# Patient Record
Sex: Female | Born: 1990 | Race: White | Hispanic: No | Smoking: Never smoker
Health system: Southern US, Community
[De-identification: ages and names within clinical notes are randomized; demographics above are authoritative.]

## PROBLEM LIST (undated history)

## (undated) DIAGNOSIS — E162 Hypoglycemia, unspecified: Secondary | ICD-10-CM

## (undated) DIAGNOSIS — J45909 Unspecified asthma, uncomplicated: Secondary | ICD-10-CM

## (undated) HISTORY — PX: CYST EXCISION: SHX5701

## (undated) HISTORY — DX: Unspecified asthma, uncomplicated: J45.909

## (undated) HISTORY — DX: Hypoglycemia, unspecified: E16.2

## (undated) HISTORY — PX: APPENDECTOMY: SHX54

## (undated) HISTORY — PX: OTHER SURGICAL HISTORY: SHX169

---
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
## 2010-03-24 ENCOUNTER — Emergency Department (HOSPITAL_COMMUNITY): Admission: EM | Admit: 2010-03-24 | Discharge: 2010-03-24 | Payer: Self-pay | Admitting: Emergency Medicine

## 2010-03-24 IMAGING — CT CT ABD-PELV W/O CM
3 of 4 series · 10 of 46 positions shown, 15 images · non-contrast
Comparison: None.

CLINICAL DATA: Low back pain.  Right side flank pain.

CT ABDOMEN AND PELVIS WITHOUT CONTRAST
TECHNIQUE: Multidetector CT imaging of the abdomen and pelvis was
performed following the standard protocol without intravenous
contrast.

[Series 3: lung 5.0 b60f · axial · 0.58mm/px · z∈[+583,+683]mm · 6 of 30 slices shown, 11 images]
[im 5/30  soft-tissue]
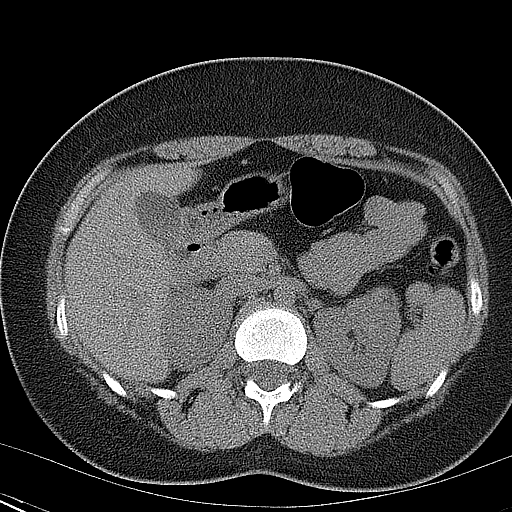
[im 5/30  bone]
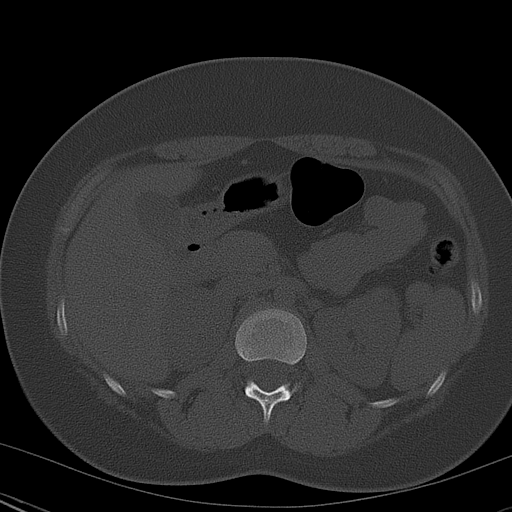
[im 9/30  soft-tissue]
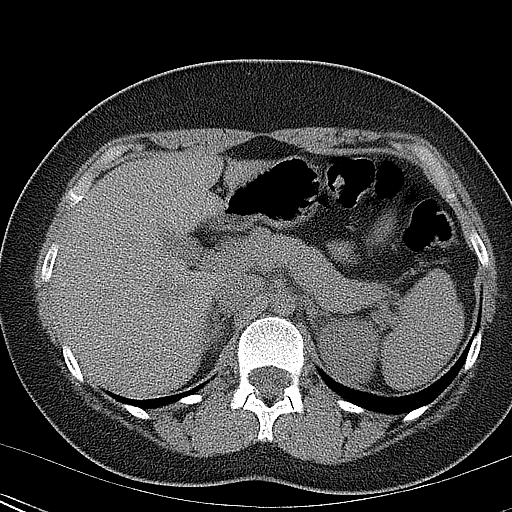
[im 13/30  soft-tissue]
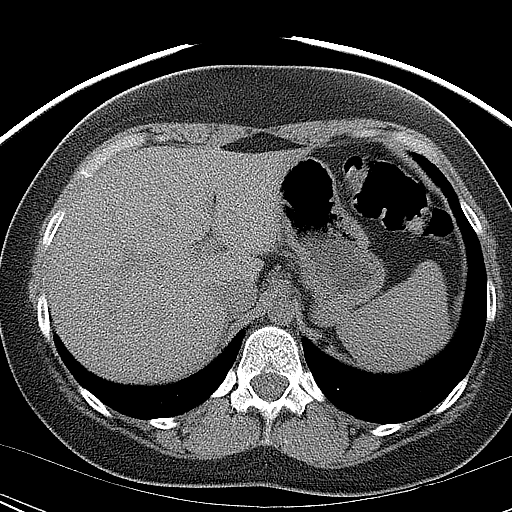
[im 13/30  lung]
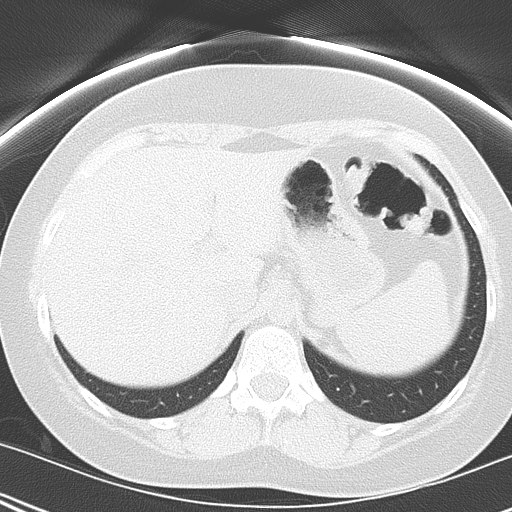
[im 17/30  soft-tissue]
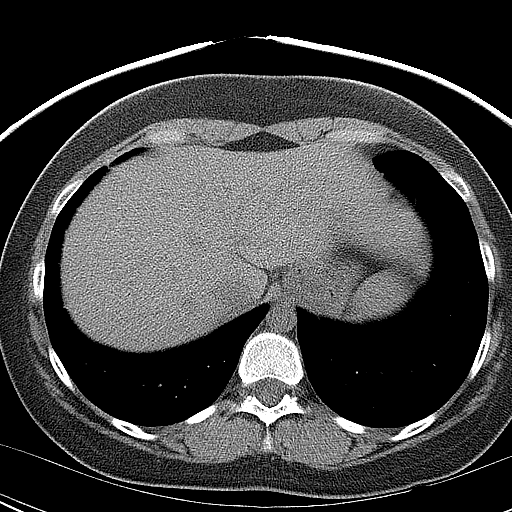
[im 17/30  lung]
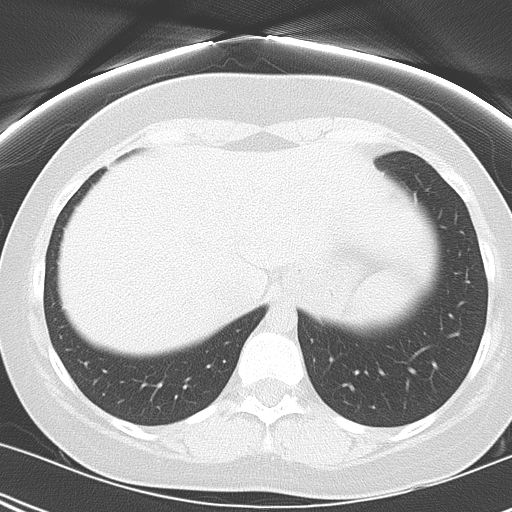
[im 21/30  soft-tissue]
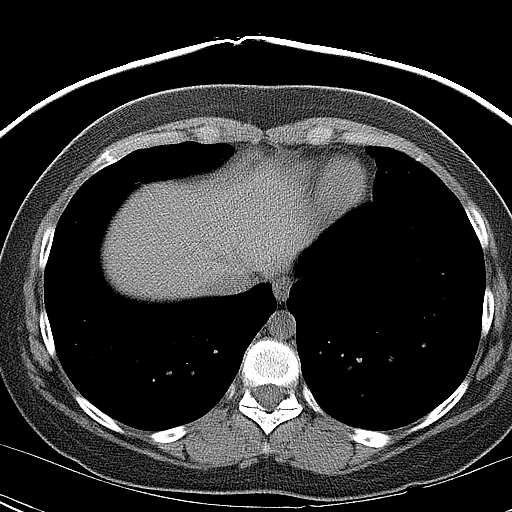
[im 21/30  lung]
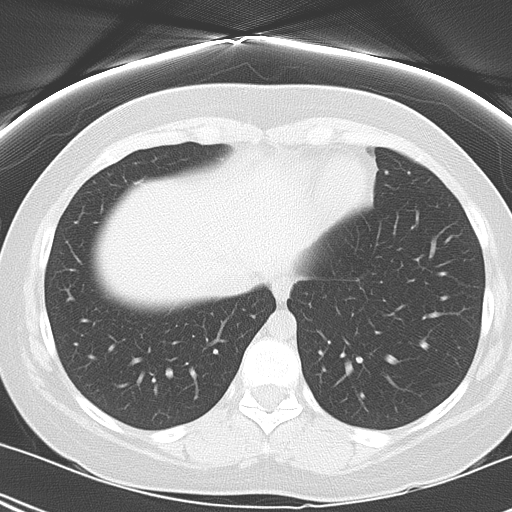
[im 25/30  soft-tissue]
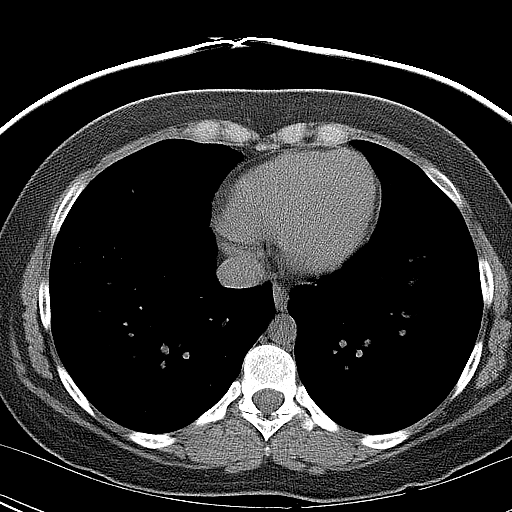
[im 25/30  lung]
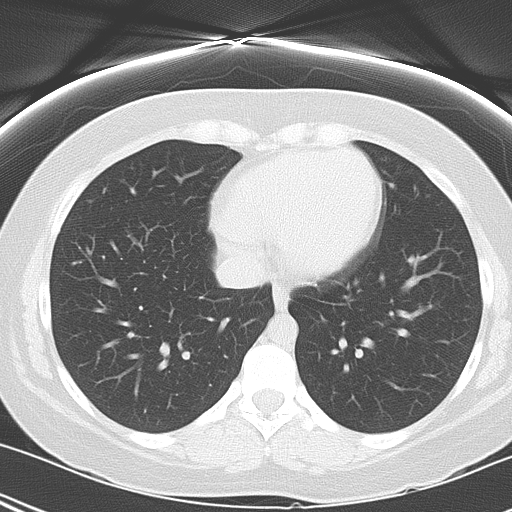

[Series 4: mpr coronal (id) · coronal · 0.57mm/px · 3 of 91 slices shown]
[im 31/91  soft-tissue]
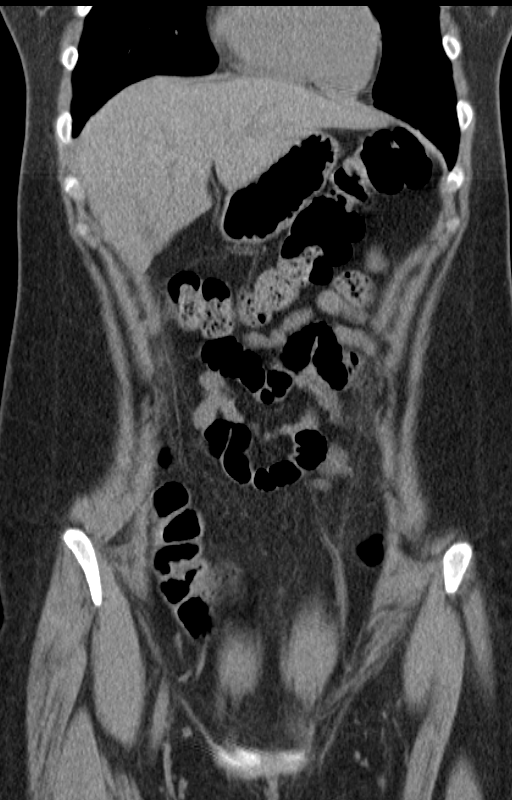
[im 41/91  soft-tissue]
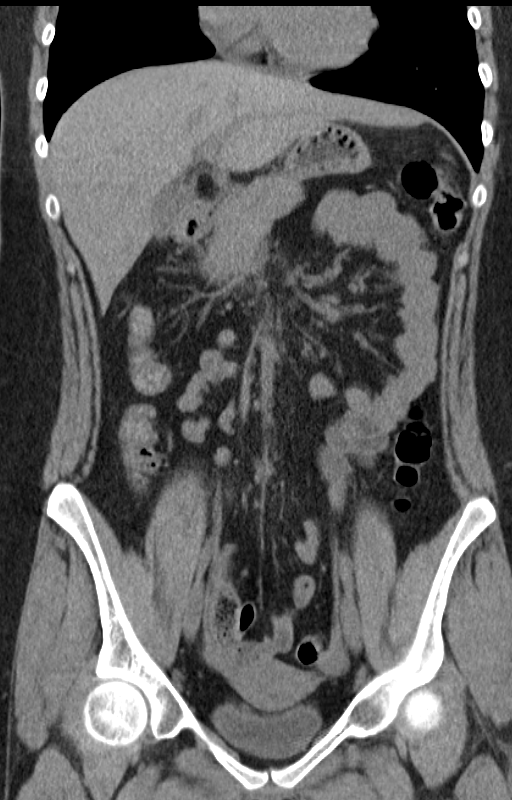
[im 51/91  soft-tissue]
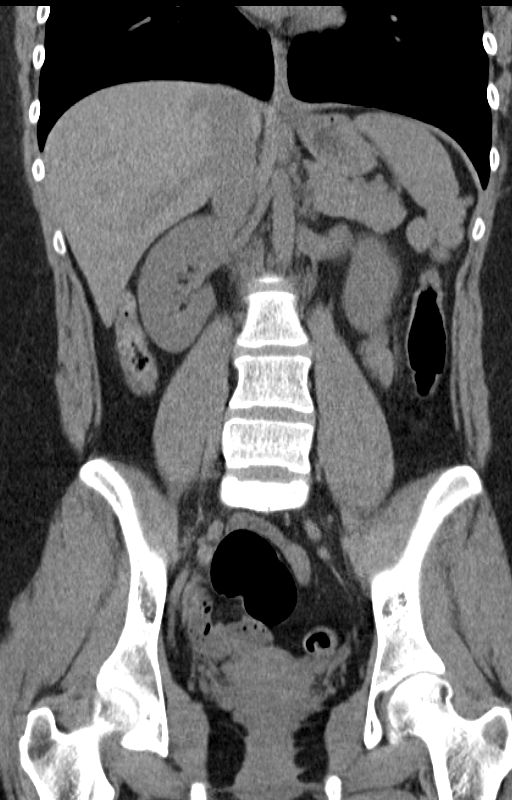

[Series 5: mpr sagittal (id) · sagittal · 0.51mm/px · 1 of 98 slices shown]
[im 33/98  soft-tissue]
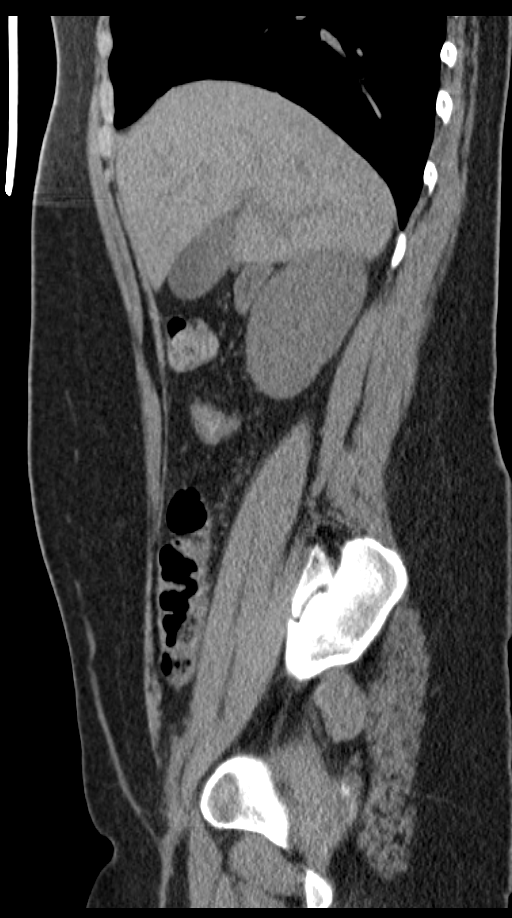

[10 of 46 positions shown; findings below may reference images not displayed]

FINDINGS: The liver and spleen have normal uninfused imaging
features.  The stomach, duodenum, pancreas, gallbladder, adrenal
glands, and kidneys are unremarkable.  Specifically, there is no
evidence for renal calculi or secondary changes in either kidney.

No abdominal aortic aneurysm.  No abdominal lymphadenopathy.  No
abdominal free fluid.  The abdominal bowel loops are normal.

Imaging through the pelvis shows no stones in the bladder.  The no
evidence for ureteral stone.  Uterus is unremarkable.  No adnexal
mass.  The terminal ileum is normal.  The appendix is normal.
Borderline lymphadenopathy is seen in the ileocolic mesentery.

Bone windows reveal no worrisome lytic or sclerotic osseous
lesions.
IMPRESSION: Upper normal lymph nodes in the ileocolic mesentery.  Otherwise
normal exam.

## 2010-04-06 ENCOUNTER — Ambulatory Visit: Payer: Self-pay | Admitting: Otolaryngology

## 2010-04-11 ENCOUNTER — Ambulatory Visit (HOSPITAL_COMMUNITY): Admission: RE | Admit: 2010-04-11 | Discharge: 2010-04-11 | Payer: Self-pay | Admitting: Otolaryngology

## 2010-04-11 IMAGING — CT CT NECK W/ CM
3 of 4 series · 12 of 33 positions shown, 14 images · IV contrast (Omnipaque 300)
Comparison: None.

CLINICAL DATA: 18-year-old female with left neck mass, reportedly
times 3 years.

CT NECK WITH CONTRAST
TECHNIQUE: Multidetector CT imaging of the neck was performed with
intravenous contrast.
Contrast: 100 ml [JB].

[Series 2: soft tissue neck 2.0 b31s · axial · 0.35mm/px · z∈[+50,+198]mm · 4 of 112 slices shown, 5 images]
[im 19/112  soft-tissue]
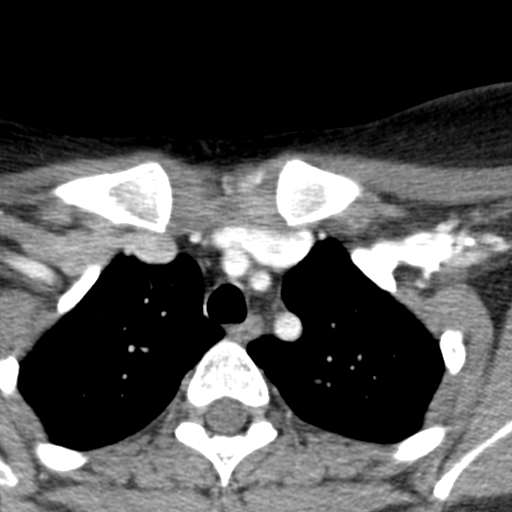
[im 19/112  bone]
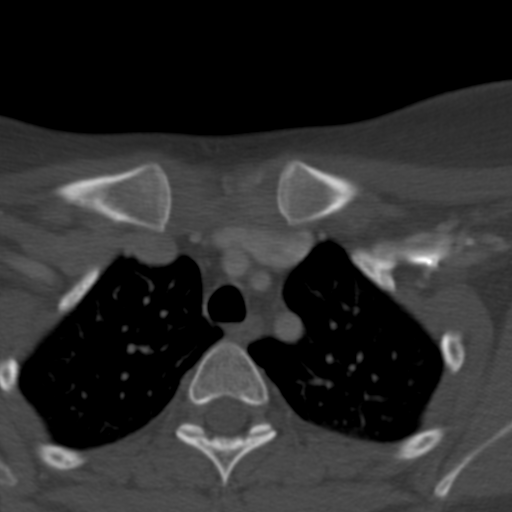
[im 38/112  bone]
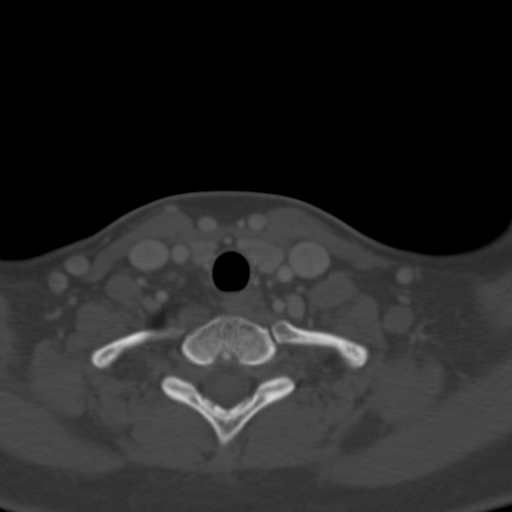
[im 75/112  bone]
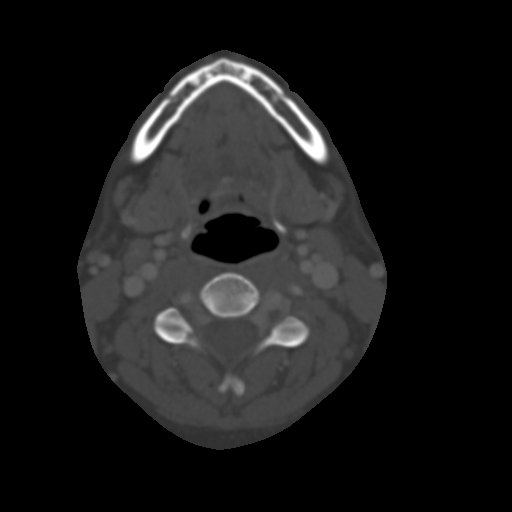
[im 93/112  bone]
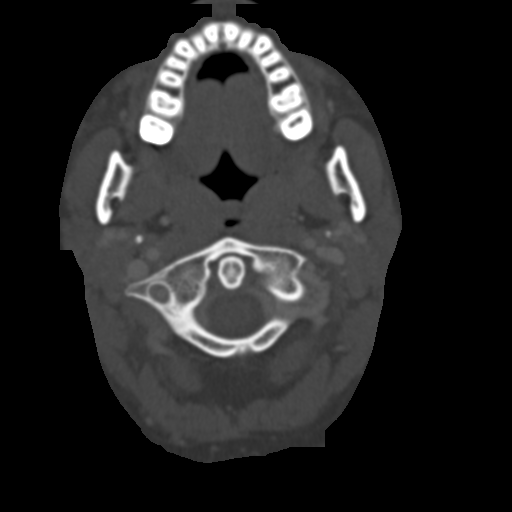

[Series 4: neck 2.0 soft tissue coronal · coronal · 0.28mm/px · 3 of 76 slices shown]
[im 16/76  bone]
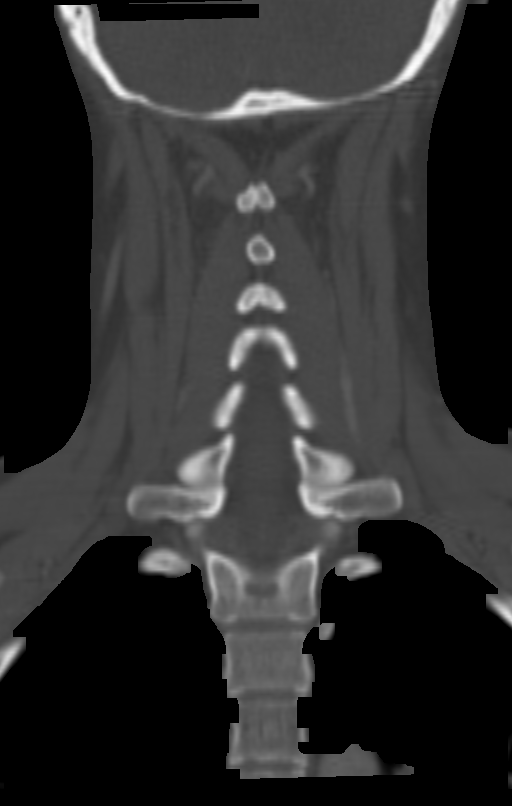
[im 31/76  bone]
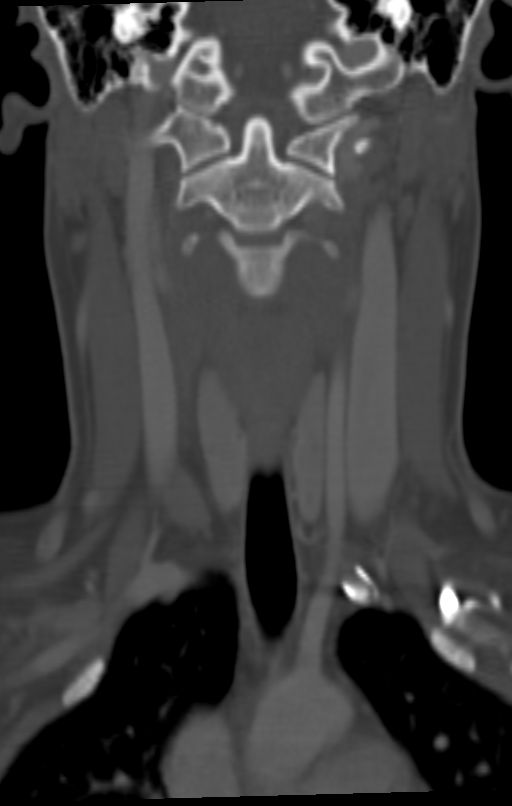
[im 46/76  bone]
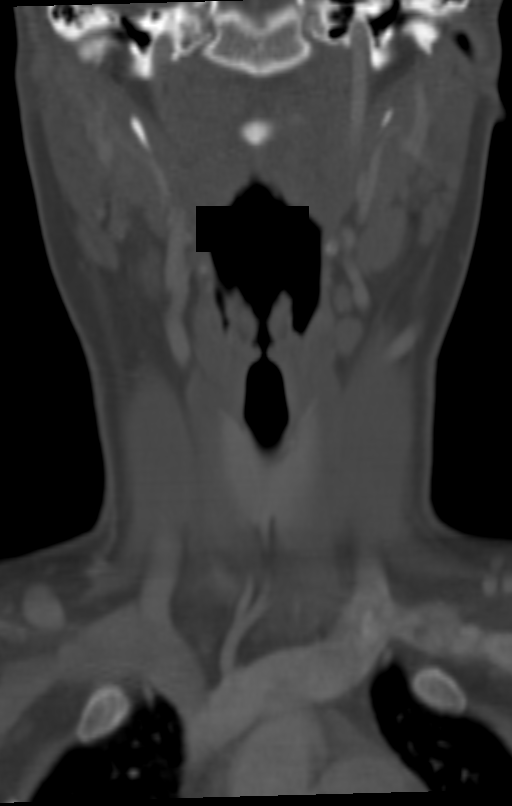

[Series 5: neck 2.0 soft tissue sag · sagittal · 0.31mm/px · 5 of 71 slices shown, 6 images]
[im 24/71  bone]
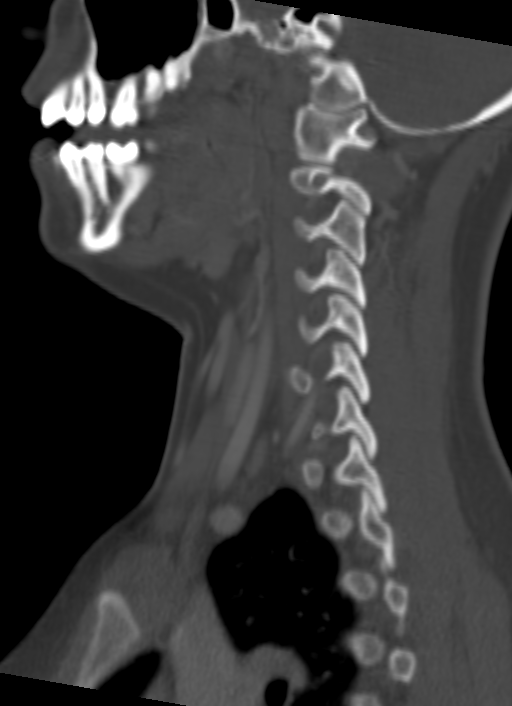
[im 30/71  bone]
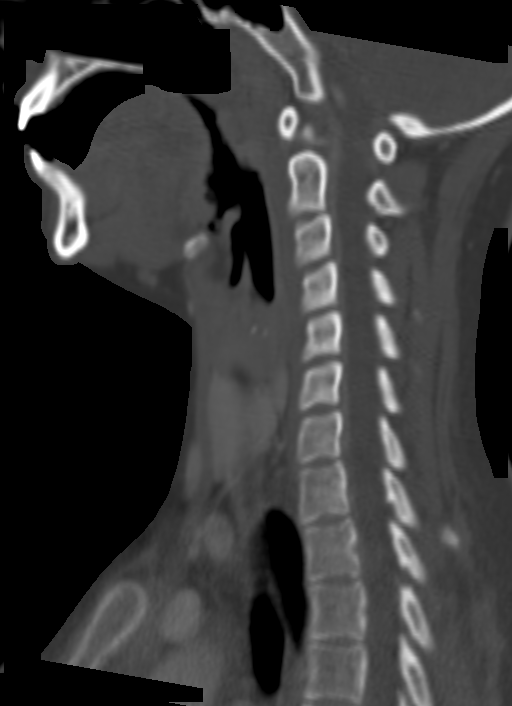
[im 36/71  soft-tissue]
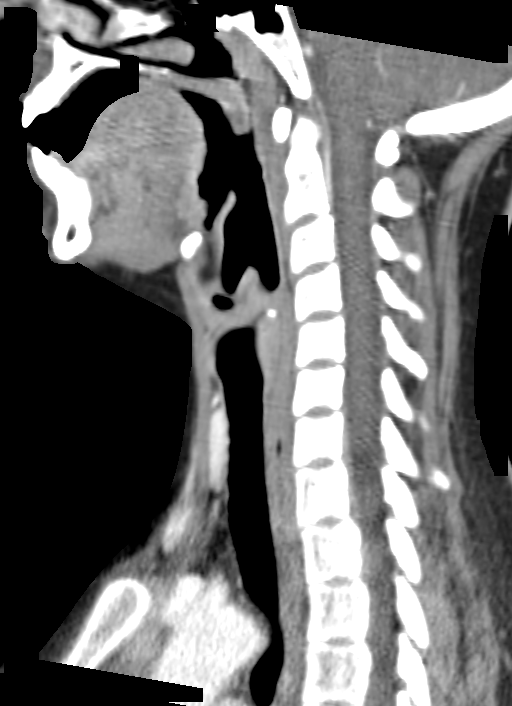
[im 36/71  bone]
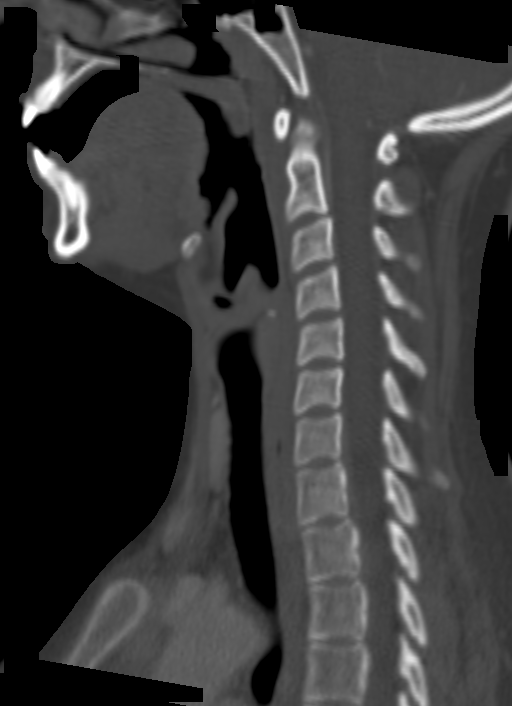
[im 41/71  bone]
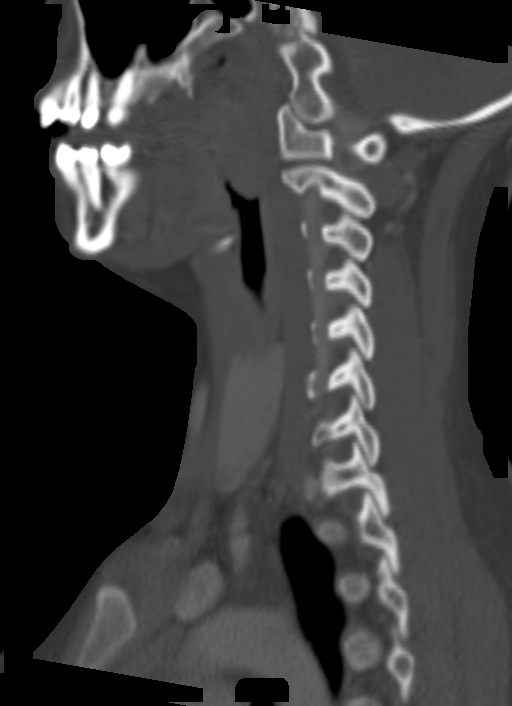
[im 47/71  bone]
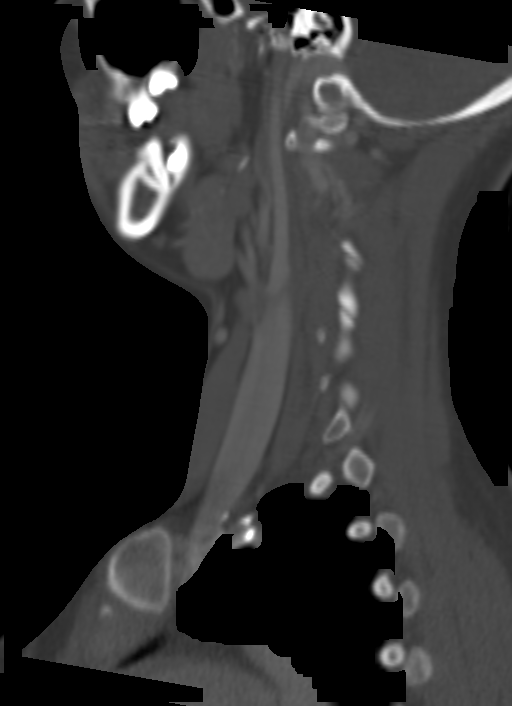

[12 of 33 positions shown; findings below may reference images not displayed]

FINDINGS: Cervical lymph nodes are fairly numerous bilaterally.
Left side level IIA nodes are slightly larger than the right (9 mm
short axis versus 7-8 mm short axis).  There is a 7 mm asymmetric
left level IIIA node on series 2 image 48.  Other nodes are very
small.  No discrete neck mass or other asymmetry of the neck soft
tissues is identified.

Major vascular structures are patent.  There is a prominent venous
confluence of the left external jugular vein and midline/thyroidal
vein near the left clavicle (series 4 image 32). No acute osseous
abnormality identified.  Visualized lungs are clear aside from mild
atelectasis.  Symmetric appearance of tonsillar pillars and mild
adenoid hypertrophy. Otherwise the pharyngeal mucosal spaces are
within normal limits.  Thyroid is unremarkable.  Parapharyngeal,
retropharyngeal and sublingual spaces are within normal limits.
Submandibular and parotid glands are within normal limits.
Visualized paranasal sinuses and mastoids are clear.  Visualized
brain parenchyma is within normal limits.
IMPRESSION: Mild asymmetry of sub centimeter left level IIA and IIIA lymph
nodes.  Otherwise, no left neck asymmetry or abnormality.
The area of clinical concern could not be palpated/marked at the
time of the exam.

## 2010-08-23 ENCOUNTER — Emergency Department (HOSPITAL_COMMUNITY)
Admission: EM | Admit: 2010-08-23 | Discharge: 2010-08-23 | Payer: Self-pay | Source: Home / Self Care | Admitting: Emergency Medicine
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
## 2010-11-18 LAB — URINALYSIS, ROUTINE W REFLEX MICROSCOPIC
Bilirubin Urine: NEGATIVE
Glucose, UA: NEGATIVE mg/dL
Hgb urine dipstick: NEGATIVE
Ketones, ur: NEGATIVE mg/dL
Nitrite: NEGATIVE
Protein, ur: NEGATIVE mg/dL
Specific Gravity, Urine: >1.03 — ABNORMAL HIGH (ref 1.005–1.030)
Urobilinogen, UA: 0.2 mg/dL (ref 0.0–1.0)
pH: 5.5 (ref 5.0–8.0)

## 2010-11-18 LAB — POCT PREGNANCY, URINE: Preg Test, Ur: NEGATIVE

## 2010-12-11 LAB — URINE MICROSCOPIC-ADD ON

## 2010-12-11 LAB — URINE CULTURE: Colony Count: 30000

## 2010-12-11 LAB — URINALYSIS, ROUTINE W REFLEX MICROSCOPIC
Bilirubin Urine: NEGATIVE
Glucose, UA: NEGATIVE mg/dL
Ketones, ur: NEGATIVE mg/dL
Nitrite: NEGATIVE
Protein, ur: NEGATIVE mg/dL
Specific Gravity, Urine: 1.01 (ref 1.005–1.030)
Urobilinogen, UA: 0.2 mg/dL (ref 0.0–1.0)
pH: 7 (ref 5.0–8.0)

## 2010-12-11 LAB — PREGNANCY, URINE: Preg Test, Ur: NEGATIVE

## 2011-01-19 NOTE — Op Note (Signed)
NAMEROSELENE, GRAY                ACCOUNT NO.:  [id number]   MEDICAL RECORD NO.:  [id number]          PATIENT TYPE:  AMB   LOCATION:  [facility]                          FACILITY:  [facility]   PHYSICIAN:  Vikki Ports, MDDATE OF BIRTH:  12/30/1990   DATE OF PROCEDURE:  03/07/2005  DATE OF DISCHARGE:                                 OPERATIVE REPORT   PREOPERATIVE DIAGNOSIS:  Pilonidal cyst and sinus.   POSTOPERATIVE DIAGNOSIS:  Pilonidal cyst and sinus.   PROCEDURE:  Pilonidal cystectomy.   SURGEON:  Vikki Ports, M.D.   ANESTHESIA:  General.   DESCRIPTION OF PROCEDURE:  The patient was taken to the operating room and  placed in a supine position.  After adequate general anesthesia was induced  using endotracheal, the patient was placed in the prone position.  The  superior gluteal cleft was prepped and draped in a normal sterile fashion.  The buttocks were taped apart.  An elliptical incision was made around the  obvious sinus openings removing all tissue en bloc down to the coccygeal  fascia.  There was no further inflammation.  No other evidence of abscess  was noted.  The wound was then closed with interrupted 2-0 Vicryl sutures in  multiple layers and the skin was closed with interrupted 2-0 nylon vertical  mattress sutures and staples.  A sterile dressing was applied.  The patient  tolerated the procedure well and went to the PACU in good condition.       KRH/MEDQ  D:  03/07/2005  T:  03/07/2005  Job:  [id number]

## 2011-01-19 NOTE — Op Note (Signed)
[name], Nancy Novak                ACCOUNT NO.:  [id number]   MEDICAL RECORD NO.:  [id number]          PATIENT TYPE:  OUT   LOCATION:  [facility]                         FACILITY:  [facility]   PHYSICIAN:  Vikki Ports, MDDATE OF BIRTH:  03-02-1991   DATE OF PROCEDURE:  03/21/2005  DATE OF DISCHARGE:  03/07/2005                                 OPERATIVE REPORT   PREOPERATIVE DIAGNOSIS:  Pilonidal cyst and sinus.   POSTOPERATIVE DIAGNOSIS:  Pilonidal cyst and sinus.   PROCEDURE:  Pilonidal cystectomy.   SURGEON:  Vikki Ports, M.D.   ANESTHESIA:  General.   DESCRIPTION OF PROCEDURE:  The patient was taken to the operating room and  placed in the supine position after adequate general anesthesia was induced.  The patient was placed in the prone position.  The superior gluteal cleft  was prepped and draped in the normal sterile fashion.  An elliptical  incision was made around the sinus openings and dissected down onto the  coccyx, excising all tissue en bloc.  This was sent for pathologic  evaluation.  Adequate hemostasis was ensured and it was closed in layers  using deep #2-0 Vicryl sutures, and the skin was closed with interrupted #3-  0 nylon vertical mattress sutures, alternating with staples.  A sterile  dressing was applied.   The patient tolerated the procedure well and went to PACU in good condition.       KRH/MEDQ  D:  03/21/2005  T:  03/21/2005  Job:  [id number]

## 2012-03-03 ENCOUNTER — Ambulatory Visit (HOSPITAL_COMMUNITY)
Admission: RE | Admit: 2012-03-03 | Discharge: 2012-03-03 | Disposition: A | Discharge status: Home / Self Care | Payer: BC Managed Care – PPO | Source: Ambulatory Visit | Attending: Pediatrics | Admitting: Pediatrics

## 2012-03-03 ENCOUNTER — Other Ambulatory Visit (HOSPITAL_COMMUNITY): Payer: Self-pay | Admitting: Pediatrics

## 2012-03-03 DIAGNOSIS — R229 Localized swelling, mass and lump, unspecified: Secondary | ICD-10-CM | POA: Insufficient documentation

## 2012-03-03 IMAGING — CR DG TOE 3RD 2+V*R*
3 series · 3 of 3 positions shown · non-contrast
Comparison: None.

CLINICAL DATA: Mass on the right third toe.

RIGHT THIRD TOE

[view not recorded (1 of 3)]
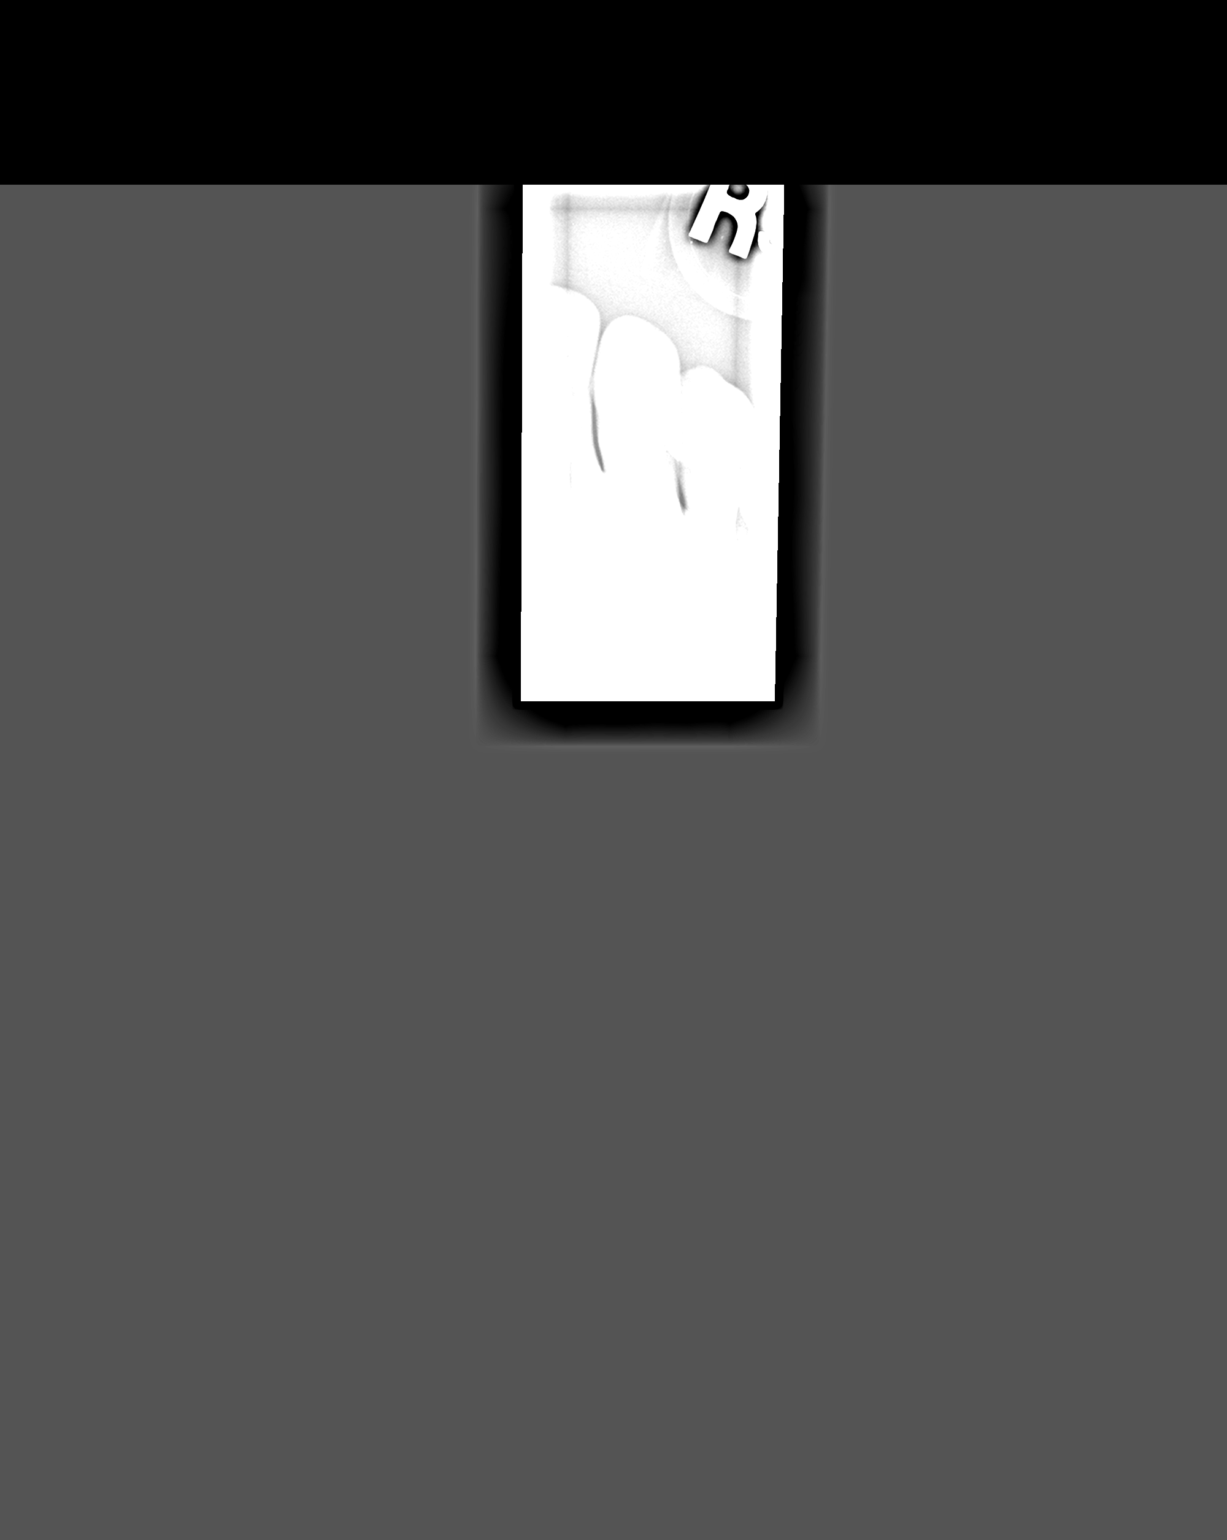

[view not recorded (2 of 3)]
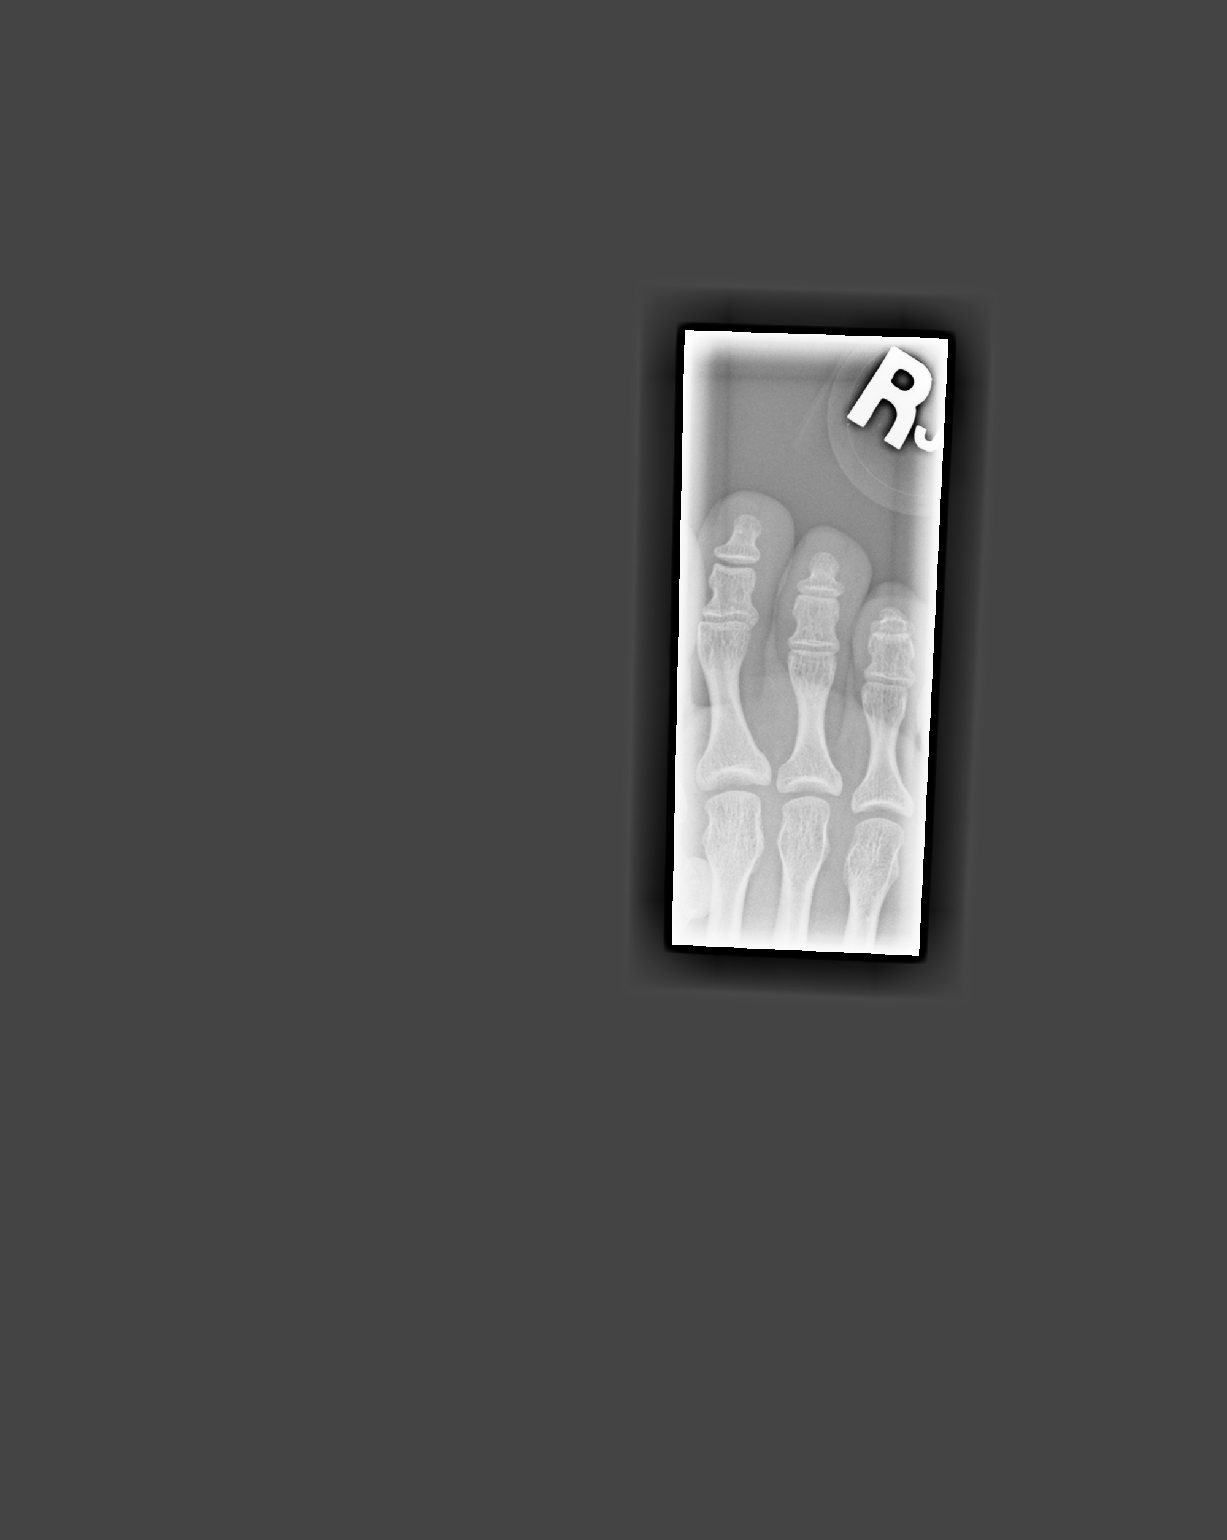

[view not recorded (3 of 3)]
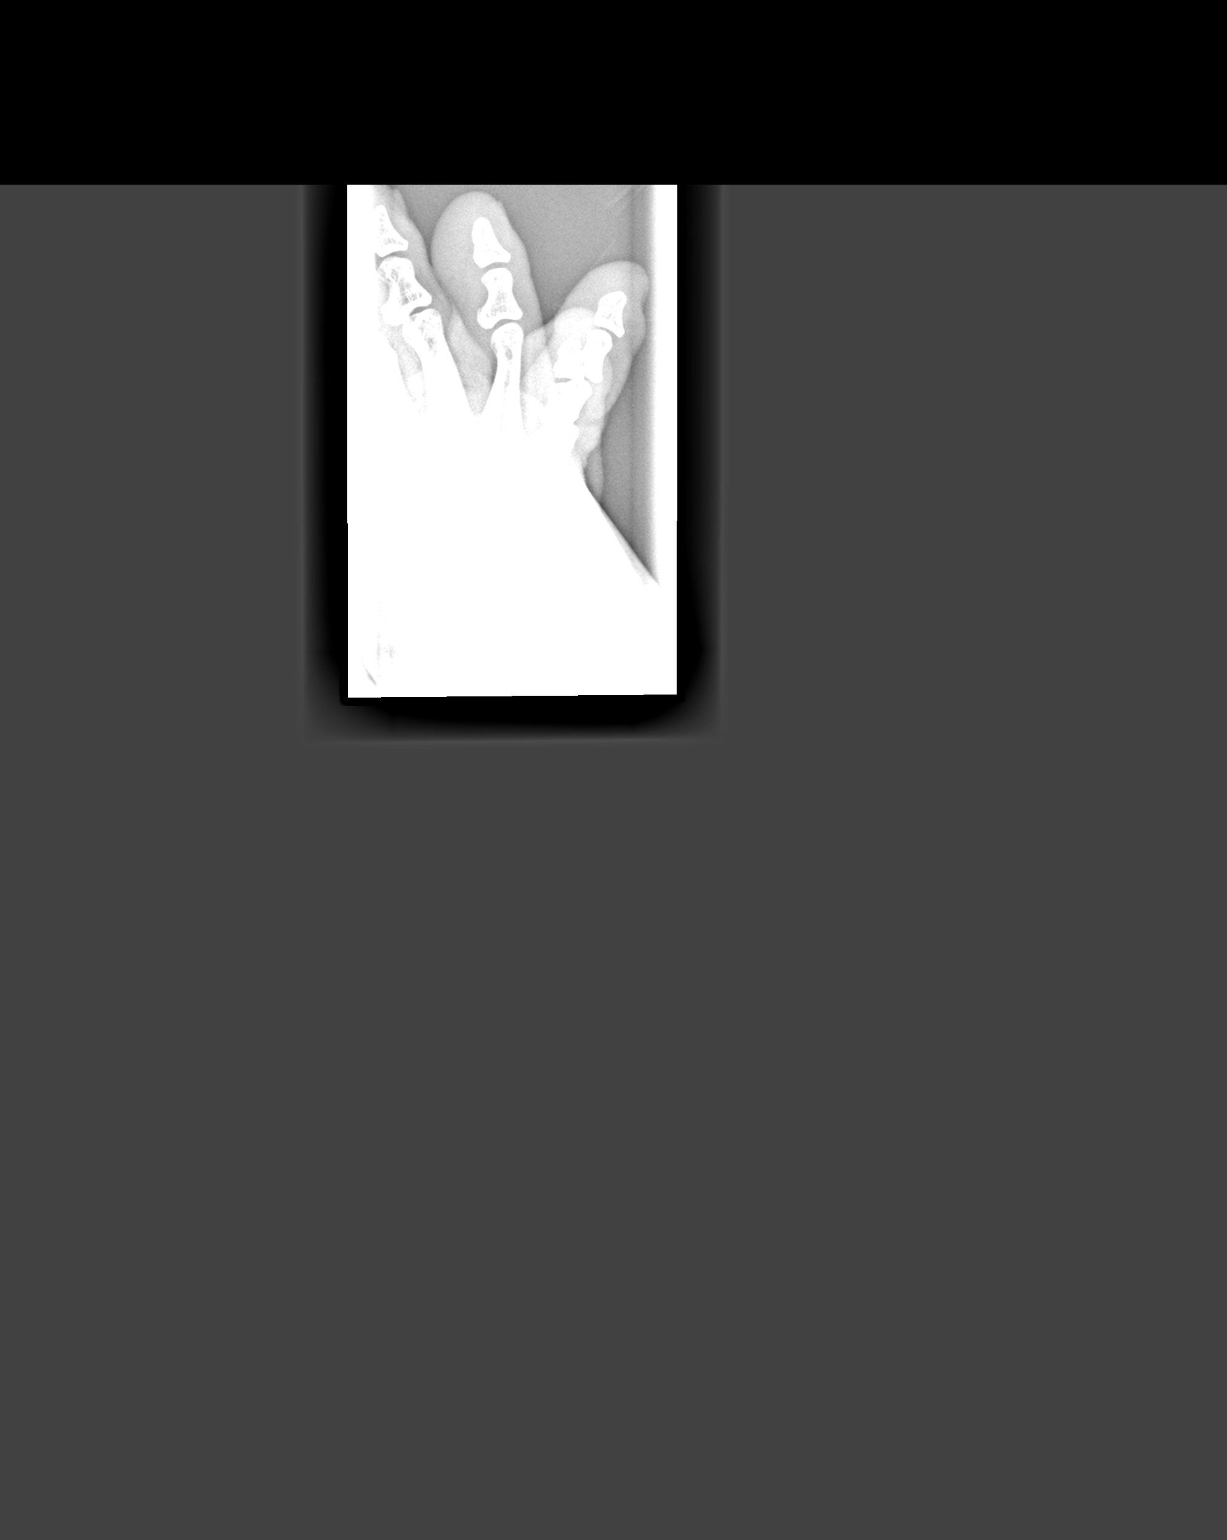

[3 of 3 positions shown; findings below may reference images not displayed]

FINDINGS: There are no osseous abnormalities of the third toe.  No
erosions or joint space narrowing.
IMPRESSION: Normal exam.  No visible mass.

## 2012-03-31 ENCOUNTER — Telehealth (HOSPITAL_COMMUNITY): Payer: Self-pay | Admitting: Dietician

## 2012-03-31 NOTE — Telephone Encounter (Signed)
Received referral via fax from Dr. Khalifa for dx: obesity.  

## 2012-04-04 NOTE — Telephone Encounter (Signed)
Sent letter to pt home via [employer] Mail in attempt to contact pt to schedule appointment.  

## 2012-04-11 NOTE — Telephone Encounter (Signed)
Sent letter to pt home via [employer] Mail in attempt to contact pt to schedule appointment.  

## 2012-04-18 NOTE — Telephone Encounter (Signed)
Pt has not responded to attempts to contact to schedule appointment. Referral filed.  

## 2012-12-03 ENCOUNTER — Other Ambulatory Visit: Payer: Self-pay | Admitting: Obstetrics & Gynecology

## 2013-03-16 ENCOUNTER — Ambulatory Visit: Payer: BC Managed Care – PPO | Attending: Sports Medicine | Admitting: Physical Therapy

## 2013-03-16 DIAGNOSIS — M25669 Stiffness of unspecified knee, not elsewhere classified: Secondary | ICD-10-CM | POA: Insufficient documentation

## 2013-03-16 DIAGNOSIS — R269 Unspecified abnormalities of gait and mobility: Secondary | ICD-10-CM | POA: Insufficient documentation

## 2013-03-16 DIAGNOSIS — R5381 Other malaise: Secondary | ICD-10-CM | POA: Insufficient documentation

## 2013-03-16 DIAGNOSIS — IMO0001 Reserved for inherently not codable concepts without codable children: Secondary | ICD-10-CM | POA: Insufficient documentation

## 2013-03-16 DIAGNOSIS — M25569 Pain in unspecified knee: Secondary | ICD-10-CM | POA: Insufficient documentation

## 2013-03-18 ENCOUNTER — Ambulatory Visit: Payer: BC Managed Care – PPO | Admitting: Physical Therapy

## 2013-03-24 ENCOUNTER — Ambulatory Visit: Payer: BC Managed Care – PPO | Admitting: Physical Therapy

## 2013-03-25 ENCOUNTER — Other Ambulatory Visit: Payer: Self-pay | Admitting: Obstetrics & Gynecology

## 2013-03-26 ENCOUNTER — Ambulatory Visit: Payer: BC Managed Care – PPO | Admitting: Physical Therapy

## 2013-04-02 ENCOUNTER — Ambulatory Visit: Payer: BC Managed Care – PPO | Admitting: Physical Therapy

## 2013-04-07 ENCOUNTER — Ambulatory Visit: Payer: BC Managed Care – PPO | Attending: Sports Medicine | Admitting: Physical Therapy

## 2013-04-07 DIAGNOSIS — R269 Unspecified abnormalities of gait and mobility: Secondary | ICD-10-CM | POA: Insufficient documentation

## 2013-04-07 DIAGNOSIS — IMO0001 Reserved for inherently not codable concepts without codable children: Secondary | ICD-10-CM | POA: Insufficient documentation

## 2013-04-07 DIAGNOSIS — M25569 Pain in unspecified knee: Secondary | ICD-10-CM | POA: Insufficient documentation

## 2013-04-07 DIAGNOSIS — M25669 Stiffness of unspecified knee, not elsewhere classified: Secondary | ICD-10-CM | POA: Insufficient documentation

## 2013-04-07 DIAGNOSIS — R5381 Other malaise: Secondary | ICD-10-CM | POA: Insufficient documentation

## 2013-04-09 ENCOUNTER — Ambulatory Visit: Payer: BC Managed Care – PPO | Admitting: Physical Therapy

## 2013-04-14 ENCOUNTER — Ambulatory Visit: Payer: BC Managed Care – PPO

## 2013-04-16 ENCOUNTER — Encounter: Payer: BC Managed Care – PPO | Admitting: Physical Therapy

## 2013-06-13 ENCOUNTER — Other Ambulatory Visit: Payer: Self-pay | Admitting: Obstetrics & Gynecology

## 2013-10-05 ENCOUNTER — Other Ambulatory Visit: Payer: Self-pay | Admitting: *Deleted

## 2013-10-05 MED ORDER — NORGESTIMATE-ETH ESTRADIOL 0.25-35 MG-MCG PO TABS: 1.0000 | ORAL_TABLET | Freq: Every day | ORAL | Status: DC

## 2013-10-07 ENCOUNTER — Other Ambulatory Visit: Payer: Self-pay | Admitting: *Deleted

## 2013-10-07 MED ORDER — NORGESTIM-ETH ESTRAD TRIPHASIC 0.18/0.215/0.25 MG-35 MCG PO TABS: 1.0000 | ORAL_TABLET | Freq: Every day | ORAL | Status: DC

## 2013-10-08 ENCOUNTER — Telehealth: Payer: Self-pay | Admitting: *Deleted

## 2013-10-08 NOTE — Telephone Encounter (Signed)
Pt informed refill for OCP sent to [facility], [place]. Pt requesting pharmacy to be changed to [facility], [place].

## 2013-10-21 ENCOUNTER — Other Ambulatory Visit: Payer: Self-pay | Admitting: Women's Health

## 2013-11-06 ENCOUNTER — Telehealth: Payer: Self-pay | Admitting: *Deleted

## 2013-11-06 NOTE — Telephone Encounter (Signed)
Pt wants pharmacy changed to [facility]. Pharmacy changed in [place].

## 2014-10-19 ENCOUNTER — Telehealth: Payer: Self-pay | Admitting: *Deleted

## 2014-10-19 NOTE — Telephone Encounter (Signed)
Pt states she does not want to get a refill on her birth control pills due to desiring pregnancy. Pt has had a HX of miscarriages and would like to discuss with a provider her concerns. Call transferred to front staff for an appt to be scheduled.

## 2014-10-26 ENCOUNTER — Encounter: Payer: Self-pay | Admitting: Obstetrics & Gynecology

## 2014-10-26 ENCOUNTER — Ambulatory Visit (INDEPENDENT_AMBULATORY_CARE_PROVIDER_SITE_OTHER): Payer: Managed Care, Other (non HMO) | Admitting: Obstetrics & Gynecology

## 2014-10-26 VITALS — BP 120/80 | Ht 64.0 in | Wt 197.0 lb

## 2014-10-26 DIAGNOSIS — Z3009 Encounter for other general counseling and advice on contraception: Secondary | ICD-10-CM

## 2014-10-26 DIAGNOSIS — Z3202 Encounter for pregnancy test, result negative: Secondary | ICD-10-CM

## 2014-10-26 DIAGNOSIS — Z3169 Encounter for other general counseling and advice on procreation: Secondary | ICD-10-CM

## 2014-10-26 DIAGNOSIS — N926 Irregular menstruation, unspecified: Secondary | ICD-10-CM

## 2014-10-26 LAB — POCT URINE PREGNANCY: Preg Test, Ur: NEGATIVE

## 2014-10-27 ENCOUNTER — Encounter: Payer: Self-pay | Admitting: Obstetrics & Gynecology

## 2014-10-27 MED ORDER — PRENATAL VITAMINS PLUS 27-1 MG PO TABS: 1.0000 | ORAL_TABLET | Freq: Every day | ORAL | Status: DC

## 2014-10-27 NOTE — Progress Notes (Signed)
Patient ID: Nancy Novak, female   DOB: [birth year], 24 y.o.   MRN: [id number] Chief Complaint  Patient presents with  . Infertility    Discuss infertility   Blood pressure 120/80, height 5\' 4"  (1.626 m), weight 197 lb (89.359 kg), last menstrual period 10/22/2014.   Nancy Novak [id number] Patient's last menstrual period was 10/22/2014.  Patient is here for preconceptual counseling, not infertility as noted above She experienced an early pregnancy loss last summer Has been on OCP since that time and done well on those She stopped her OCPs in January and just recently had her first post-contraceptive menses which was without difficulty We talked in great detail about the etiology of early pregnancy loss, statistical probability of it happening again both in her case and in general as well as the multitude of causes the vast majority of which are simply uncontrollable notable inadequate cares made at the time of ovulation and conception Patient and her husband both present of course a conversation when many different directions with those questions all were answered The patient was certainly visibly upset during part of it but was reassured at the end of our visit with the information and data given She is encouraged to begin prenatal vitamins right away in anticipation of getting pregnant sometime may or after I also recommended that as soon as she finds out she is pregnant she come in for pregnancy status evaluation clue doing a serum progesterone She agrees with that management plan  Face-to-face time was 20 minutes  Greater than 50% of the time during this visit was spent in answering questions and  in general counseling

## 2016-04-09 DIAGNOSIS — M461 Sacroiliitis, not elsewhere classified: Secondary | ICD-10-CM | POA: Insufficient documentation

## 2016-04-09 DIAGNOSIS — M5432 Sciatica, left side: Secondary | ICD-10-CM | POA: Insufficient documentation

## 2016-04-09 DIAGNOSIS — M5431 Sciatica, right side: Secondary | ICD-10-CM | POA: Insufficient documentation

## 2016-06-07 ENCOUNTER — Ambulatory Visit (INDEPENDENT_AMBULATORY_CARE_PROVIDER_SITE_OTHER): Payer: PRIVATE HEALTH INSURANCE | Admitting: Physician Assistant

## 2016-06-07 VITALS — BP 110/76 | HR 89 | Temp 98.2°F | Resp 16 | Ht 64.0 in | Wt 214.0 lb

## 2016-06-07 DIAGNOSIS — W57XXXA Bitten or stung by nonvenomous insect and other nonvenomous arthropods, initial encounter: Secondary | ICD-10-CM | POA: Diagnosis not present

## 2016-06-07 DIAGNOSIS — R21 Rash and other nonspecific skin eruption: Secondary | ICD-10-CM

## 2016-06-07 MED ORDER — CETIRIZINE HCL 10 MG PO TABS
10.0000 mg | ORAL_TABLET | Freq: Once | ORAL | Status: AC
  Administered 2016-06-07: 10 mg via ORAL

## 2016-06-07 MED ORDER — IBUPROFEN 600 MG PO TABS: 600.0000 mg | ORAL_TABLET | Freq: Three times a day (TID) | ORAL | 0 refills | Status: DC | PRN

## 2016-06-07 MED ORDER — CEPHALEXIN 500 MG PO CAPS: 500.0000 mg | ORAL_CAPSULE | Freq: Three times a day (TID) | ORAL | 0 refills | Status: DC

## 2016-06-07 MED ORDER — CETIRIZINE HCL 10 MG PO TABS: 10.0000 mg | ORAL_TABLET | Freq: Every day | ORAL | 0 refills | Status: DC

## 2016-06-07 NOTE — Progress Notes (Addendum)
06/07/2016 10:00 AM   DOB: 07-Feb-1991 / MRN: [id number]  SUBJECTIVE:  Nancy Novak is a 25 y.o. female presenting for a spider bite on her right hand which occurred at 6 AM before going into work. Currently, she has a numb/itching and "tingly" sensation in her hand. She saw the spider right after the incident and states that it had a brown color.  Patient has taken benadryl (which decreased her itching) and tylenol.  She denies fever/chills or nausea. She is actively taking birth control.  She is a new patient to the practice.   Patient is up to date with her tetanus shot (3 years ago).   She is allergic to bee venom; other; and codeine.   She  has a past medical history of Asthma.    She  reports that she has quit smoking. She has never used smokeless tobacco. She reports that she does not drink alcohol or use drugs. She  reports that she currently engages in sexual activity. She reports using the following method of birth control/protection: None. The patient  has a past surgical history that includes Cyst excision.  Her family history includes Cancer in her paternal grandfather; Hypertension in her mother and paternal grandmother.  Review of Systems  Constitutional: Negative for chills and fever.  Eyes: Negative for pain, discharge and redness.  Gastrointestinal: Negative for nausea and vomiting.  Skin: Positive for itching.    The problem list and medications were reviewed and updated by myself where necessary and exist elsewhere in the encounter.   OBJECTIVE:  BP 110/76 (BP Location: Right Arm, Patient Position: Sitting, Cuff Size: Normal)   Pulse 89   Temp 98.2 F (36.8 C) (Oral)   Resp 16   Ht 5\' 4"  (1.626 m)   Wt 214 lb (97.1 kg)   LMP 05/24/2016 (Approximate)   SpO2 100%   BMI 36.73 kg/m   Physical Exam  Constitutional: She is oriented to person, place, and time. She appears well-developed and well-nourished.  HENT:  Head: Normocephalic and atraumatic.   Cardiovascular: Normal rate.   Pulmonary/Chest: Effort normal.  Musculoskeletal:  Swelling about the right posterior pinky finger just past the MCP. Strength is normal. She is having radicular pain about the hypothenar eminence and the posterior medial hand.   Neurological: She is alert and oriented to person, place, and time.  Skin: Skin is warm and dry. Rash noted.       No results found for this or any previous visit (from the past 72 hour(s)).  No results found.  ASSESSMENT AND PLAN  Nancy Novak was seen today for insect bite.  Diagnoses and all orders for this visit:  Rash and nonspecific skin eruption: See problem two.   Insect bite, initial encounter: He exam is so benign at this point I don't want to start abx at this time.  However, I have prescribed Keflex on a paper prescription and if she develops the any symptoms of cellulitis it is okay to fill (see sig) and then RTC.  Will treat as an local allergic reaction for now.  Advised ice along with the below.  -     cephALEXin (KEFLEX) 500 MG capsule; Take 1 capsule (500 mg total) by mouth 3 (three) times daily. Fill only if you begine to have fever, chills, increasing hand redness, warmth, and/or skin tenderness. If you fill then come back to clinic. -     ibuprofen (ADVIL,MOTRIN) 600 MG tablet; Take 1 tablet (600 mg total) by mouth  every 8 (eight) hours as needed. -     cetirizine (ZYRTEC) tablet 10 mg; Take 1 tablet (10 mg total) by mouth once. -     cetirizine (ZYRTEC) 10 MG tablet; Take 1 tablet (10 mg total) by mouth daily.   This note was scribed in my presence and I performed the services described in the this documentation.   The patient is advised to call or return to clinic if she does not see an improvement in symptoms, or to seek the care of the closest emergency department if she worsens with the above plan.   Philis Fendt, MHS, PA-C [facility] 06/07/2016 10:00  AM

## 2016-06-07 NOTE — Patient Instructions (Signed)
     IF you received an x-ray today, you will receive an invoice from [facility]. Please contact [facility] at [phone] with questions or concerns regarding your invoice.   IF you received labwork today, you will receive an invoice from [facility]. Please contact [facility] at [phone] with questions or concerns regarding your invoice.   Our billing staff will not be able to assist you with questions regarding bills from these companies.  You will be contacted with the lab results as soon as they are available. The fastest way to get your results is to activate your My Chart account. Instructions are located on the last page of this paperwork. If you have not heard from us regarding the results in 2 weeks, please contact this office.      

## 2016-06-08 ENCOUNTER — Encounter: Payer: Self-pay | Admitting: Physician Assistant

## 2016-06-12 NOTE — Telephone Encounter (Signed)
INo need to call as I gave her a call back.  Fortunately her symptoms are better now and she did not have to fill abx.  This is most likely LLR.  Advised that if she has fever greater than 100.4, nausea, chills, worsening erythematous rash that she start abx, however I would expect that those symptoms would have already started by now given this message is roughly 41 days old by the time I saw it.  Philis Fendt, MS, PA-C 2:59 PM, 06/12/2016

## 2016-06-20 ENCOUNTER — Ambulatory Visit (INDEPENDENT_AMBULATORY_CARE_PROVIDER_SITE_OTHER): Payer: PRIVATE HEALTH INSURANCE

## 2016-06-20 ENCOUNTER — Ambulatory Visit (INDEPENDENT_AMBULATORY_CARE_PROVIDER_SITE_OTHER): Payer: PRIVATE HEALTH INSURANCE | Admitting: Physician Assistant

## 2016-06-20 VITALS — BP 122/78 | HR 97 | Temp 97.8°F | Resp 16 | Ht 64.0 in | Wt 211.0 lb

## 2016-06-20 DIAGNOSIS — M25571 Pain in right ankle and joints of right foot: Secondary | ICD-10-CM

## 2016-06-20 DIAGNOSIS — S93491A Sprain of other ligament of right ankle, initial encounter: Secondary | ICD-10-CM | POA: Diagnosis not present

## 2016-06-20 IMAGING — DX DG ANKLE COMPLETE 3+V*R*
3 series · 3 of 3 positions shown · non-contrast
Comparison: No recent prior.

CLINICAL DATA: Pain and swelling.

EXAM:
RIGHT ANKLE - COMPLETE 3+ VIEW

[ankle ap]
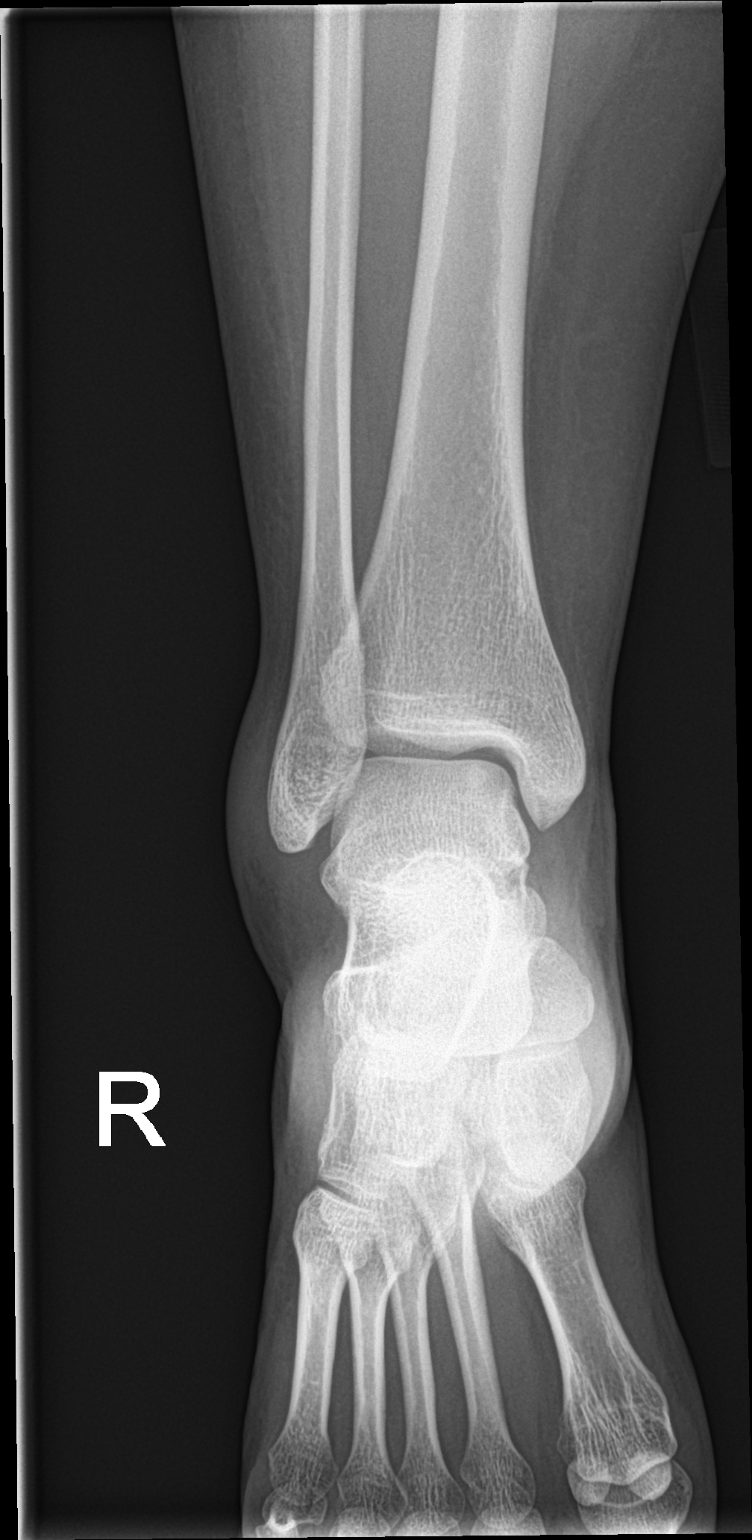

[ankle obl]
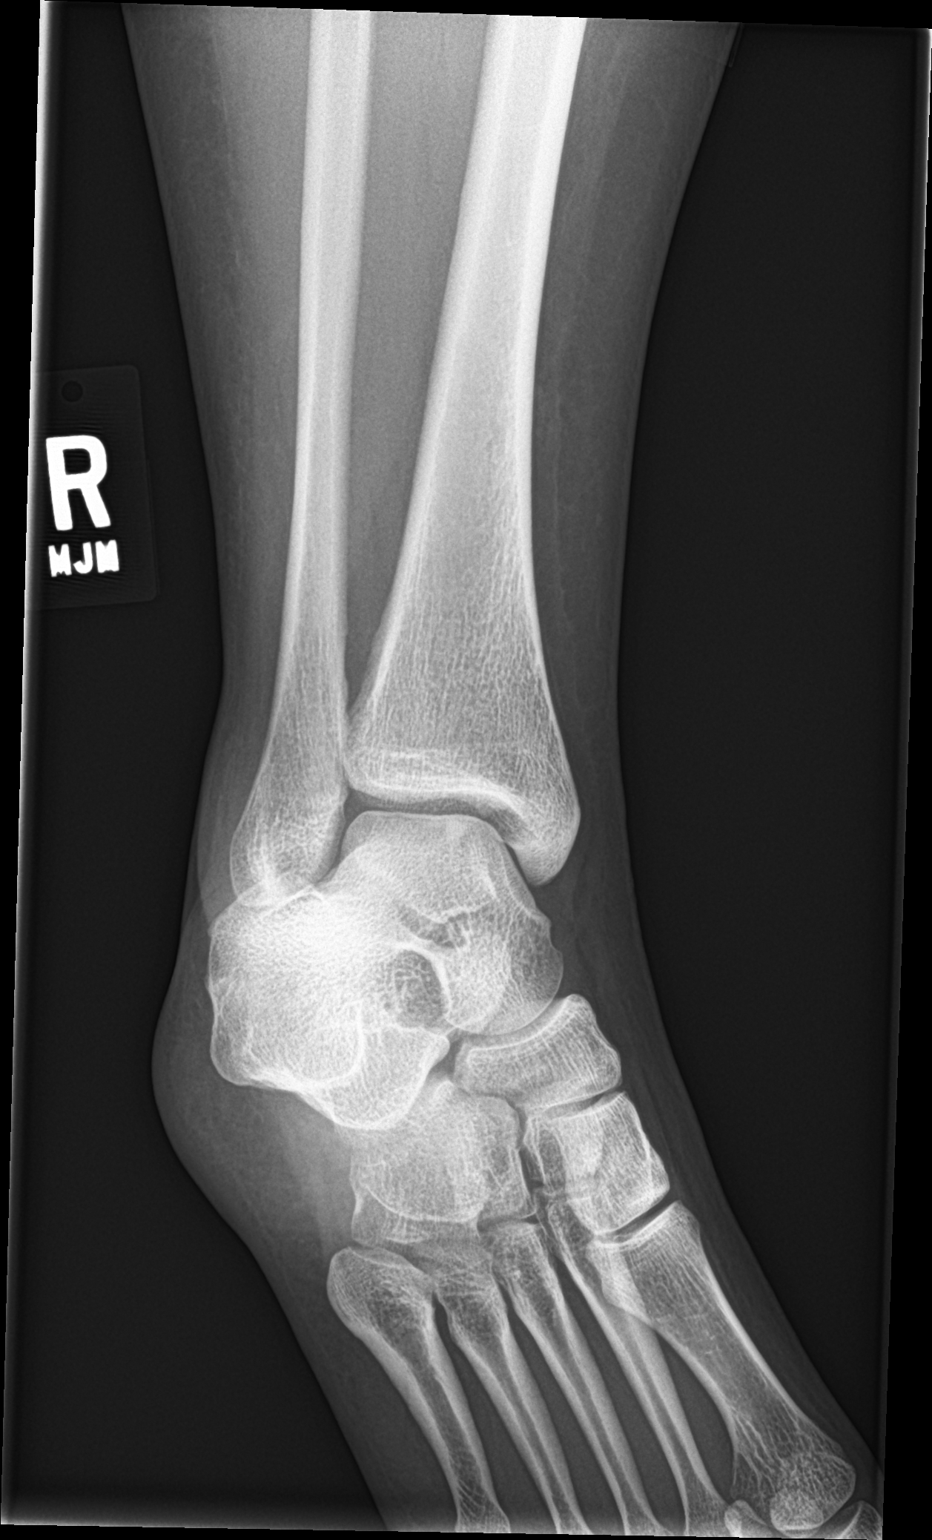

[ankle lat]
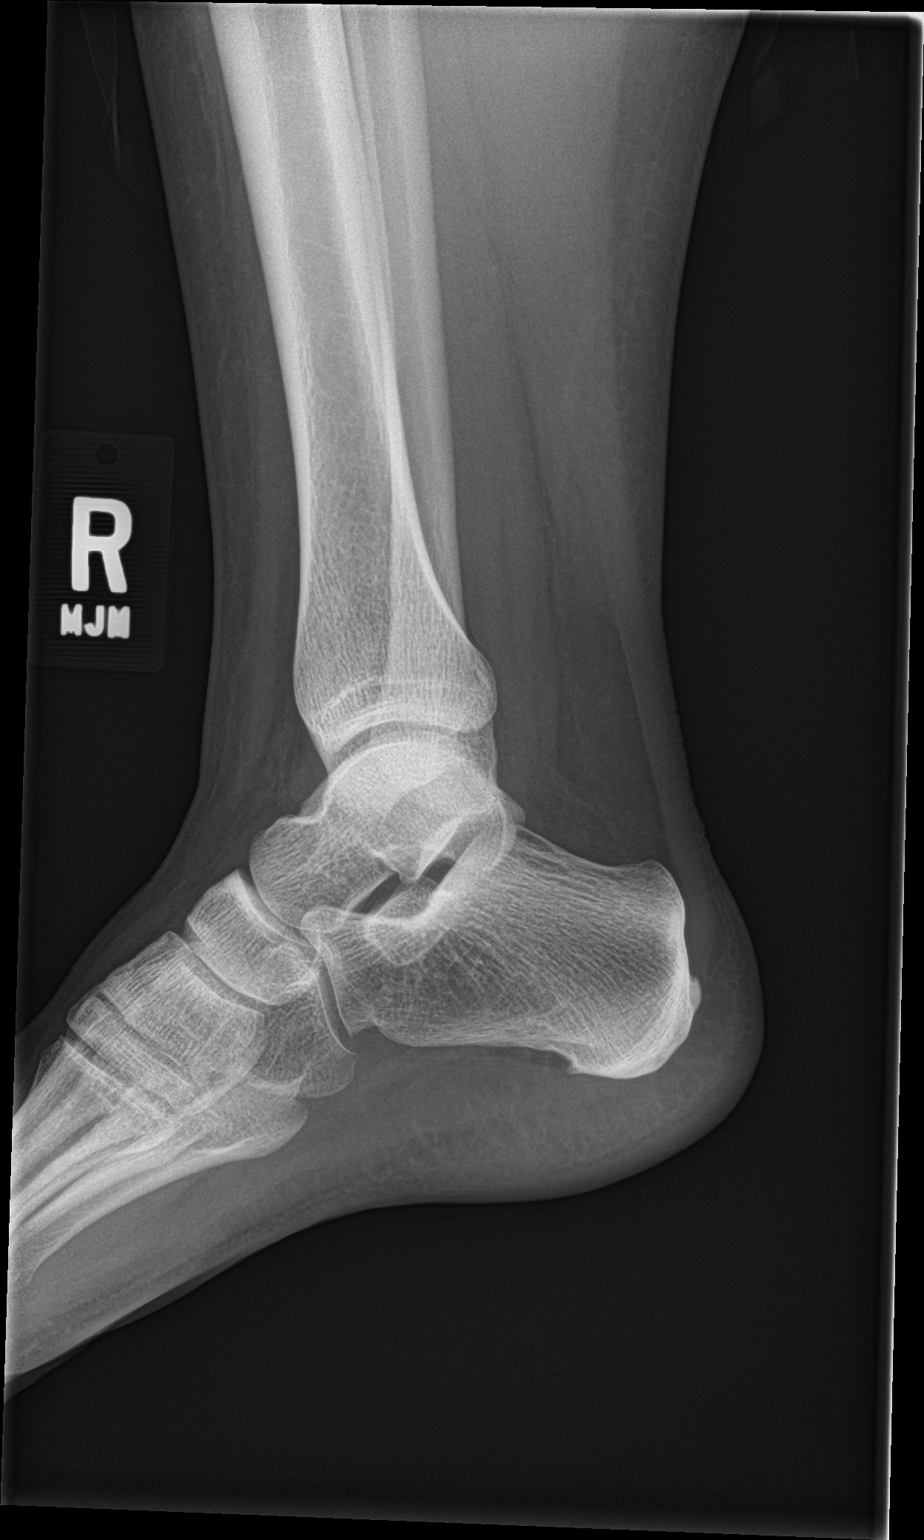

[3 of 3 positions shown; findings below may reference images not displayed]

FINDINGS: Soft tissue swelling is noted over the lateral malleolus. No
evidence of fracture or dislocation.
IMPRESSION: Soft tissue swelling noted over the lateral malleolus. No acute bony
abnormality.

## 2016-06-20 NOTE — Patient Instructions (Signed)
     IF you received an x-ray today, you will receive an invoice from [facility]. Please contact [facility] at [phone] with questions or concerns regarding your invoice.   IF you received labwork today, you will receive an invoice from [facility]. Please contact [facility] at [phone] with questions or concerns regarding your invoice.   Our billing staff will not be able to assist you with questions regarding bills from these companies.  You will be contacted with the lab results as soon as they are available. The fastest way to get your results is to activate your My Chart account. Instructions are located on the last page of this paperwork. If you have not heard from us regarding the results in 2 weeks, please contact this office.      

## 2016-06-20 NOTE — Progress Notes (Signed)
By signing my name below, I, Raven Small, attest that this documentation has been prepared under the direction and in the presence of Philis Fendt, PA-C.  Electronically Signed: Thea Alken, ED Scribe. 06/20/2016. 2:10 PM.  06/20/2016 2:10 PM   DOB: August 09, 1991 / MRN: [id number]  SUBJECTIVE:  Nancy Novak is a 25 y.o. female presenting for fall that occurred this morning. Pt tripped and fell over an extension cord at home. As a result, pt injured right ankle. Pt was able to ambulate after fall and had went to work afterwards. Walking and bearing weight to right ankle exacerbated pain. No at home treatments or medications tired.   She is allergic to bee venom; other; and codeine.   She  has a past medical history of Asthma.    She  reports that she has quit smoking. She has never used smokeless tobacco. She reports that she does not drink alcohol or use drugs. She  reports that she currently engages in sexual activity. She reports using the following method of birth control/protection: None. The patient  has a past surgical history that includes Cyst excision.  Her family history includes Cancer in her paternal grandfather; Hypertension in her mother and paternal grandmother.  Review of Systems  Musculoskeletal: Positive for falls, joint pain and myalgias.   The problem list and medications were reviewed and updated by myself where necessary and exist elsewhere in the encounter.   OBJECTIVE:  BP 122/78 (BP Location: Left Arm, Patient Position: Sitting, Cuff Size: Normal)   Pulse 97   Temp 97.8 F (36.6 C) (Oral)   Resp 16   Ht 5\' 4"  (1.626 m)   Wt 211 lb (95.7 kg)   LMP 05/24/2016 (Approximate)   SpO2 100%   BMI 36.22 kg/m   Physical Exam  Constitutional: She is oriented to person, place, and time. Vital signs are normal. She appears well-developed.  Cardiovascular: Normal rate.   Pulmonary/Chest: Effort normal. No respiratory distress.  Musculoskeletal: Normal range of  motion.  Significant swelling about later malleolus on right side. No bruising. Achilles tendon intact. No tenderness about posterior calf. Mild tenderness at 5th MT. Negative for fibular head tenderness. Mild tenderness about medial malleolus   Neurological: She is alert and oriented to person, place, and time.  Skin: Skin is warm and dry.  Psychiatric: She has a normal mood and affect. Her behavior is normal. Judgment and thought content normal.   Dg Ankle Complete Right  Result Date: 06/20/2016 CLINICAL DATA:  Pain and swelling. EXAM: RIGHT ANKLE - COMPLETE 3+ VIEW COMPARISON:  No recent prior. FINDINGS: Soft tissue swelling is noted over the lateral malleolus. No evidence of fracture or dislocation. IMPRESSION: Soft tissue swelling noted over the lateral malleolus. No acute bony abnormality. Electronically Signed   By: Marcello Moores  Register   On: 06/20/2016 14:28    ASSESSMENT AND PLAN  Kasinda was seen today for fall.  Diagnoses and all orders for this visit:  Acute right ankle pain: I have advised that she stay out of work however she is new to her work and can not afford to take time off at this point.  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  NSAIDS and tylenol for pain control.   -     DG Ankle Complete Right; Future  Sprain of anterior talofibular ligament of right ankle, initial encounter: See problem one.  The patient is advised to call or return to clinic if she does not see an improvement in symptoms, or to seek the care of the closest emergency department if she worsens with the above plan.   Philis Fendt, MHS, PA-C [facility] 06/20/2016 2:10 PM

## 2016-06-21 ENCOUNTER — Encounter: Payer: Self-pay | Admitting: Physician Assistant

## 2016-06-21 ENCOUNTER — Other Ambulatory Visit: Payer: Self-pay | Admitting: Physician Assistant

## 2016-06-21 NOTE — Telephone Encounter (Signed)
Please call and let her know I'm composing for her. Philis Fendt, MS, PA-C 1:05 PM, 06/21/2016

## 2016-06-22 NOTE — Telephone Encounter (Signed)
Printed off letter and [facility] for pt at # she left to CB to advise if she would rather p/up or have [place] fax it for her.

## 2016-10-02 ENCOUNTER — Ambulatory Visit (INDEPENDENT_AMBULATORY_CARE_PROVIDER_SITE_OTHER): Payer: PRIVATE HEALTH INSURANCE | Admitting: Family Medicine

## 2016-10-02 VITALS — BP 108/72 | HR 83 | Temp 98.0°F | Resp 18 | Ht 65.0 in | Wt 220.0 lb

## 2016-10-02 DIAGNOSIS — R599 Enlarged lymph nodes, unspecified: Secondary | ICD-10-CM | POA: Diagnosis not present

## 2016-10-02 DIAGNOSIS — R509 Fever, unspecified: Secondary | ICD-10-CM

## 2016-10-02 LAB — POCT INFLUENZA A/B
Influenza A, POC: NEGATIVE
Influenza B, POC: NEGATIVE

## 2016-10-02 MED ORDER — PREDNISONE 20 MG PO TABS: 40.0000 mg | ORAL_TABLET | Freq: Every day | ORAL | 0 refills | Status: DC

## 2016-10-02 NOTE — Progress Notes (Signed)
Patient ID: Nancy Novak, female    DOB: 1991-01-06, 26 y.o.   MRN: [id number]  PCP: No PCP Per Patient  Chief Complaint  Patient presents with  . Facial Swelling  . Fever    Unspecified    Subjective:  HPI  26 year old female presents for evaluation of facial swelling and fever x 24 hours.  Swollen lymph node above the ear and submandibular region more pronounced right side. No recent illness or respiratory symptoms. 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 Painful lymph nodes on the right side of head above ear and cervical gland. Only medication is birth control. Reports over the last 4 months she has lost 20 lbs when in fact medical documentation of weight from last 3 visits, shows a steady increase in weight.   Social History   Social History  . Marital status: Married    Spouse name: N/A  . Number of children: N/A  . Years of education: N/A   Occupational History  . Not on file.   Social History Main Topics  . Smoking status: Former Research scientist (life sciences)  . Smokeless tobacco: Never Used  . Alcohol use No  . Drug use: No  . Sexual activity: Yes    Birth control/ protection: None   Other Topics Concern  . Not on file   Social History Narrative  . No narrative on file    Family History  Problem Relation Age of Onset  . Hypertension Mother   . Hypertension Paternal Grandmother   . Cancer Paternal Grandfather     lung   Review of Systems See HPI   Allergies  Allergen Reactions  . Bee Venom Swelling  . Other Hives    Bell peppers  . Codeine Hives, Nausea Only and Rash    Prior to Admission medications   Medication Sig Start Date End Date Taking? Authorizing Provider  norgestimate-ethinyl estradiol (ORTHO-CYCLEN,SPRINTEC,PREVIFEM) 0.25-35 MG-MCG tablet Take 1 tablet by mouth daily.   Yes Historical Provider, MD    Past Medical, Surgical Family and Social  History reviewed and updated.    Objective:   Today's Vitals   10/02/16 1411  BP: 108/72  Pulse: 83  Resp: 18  Temp: 98 F (36.7 C)  TempSrc: Oral  SpO2: 100%  Weight: 220 lb (99.8 kg)  Height: 5' 5"  (1.651 m)    Wt Readings from Last 3 Encounters:  10/02/16 220 lb (99.8 kg)  06/20/16 211 lb (95.7 kg)  06/07/16 214 lb (97.1 kg)    Physical Exam  Constitutional: She is oriented to person, place, and time. She appears well-developed and well-nourished.  HENT:  Head: Normocephalic and atraumatic.  Right Ear: External ear normal.  Left Ear: External ear normal.  Nose: Nose normal.  Mouth/Throat: Oropharynx is clear and moist.  Eyes: Conjunctivae and EOM are normal. Pupils are equal, round, and reactive to light.  Neck: Normal range of motion. Neck supple.  Cardiovascular: Normal rate, regular rhythm, normal heart sounds and intact distal pulses.   Pulmonary/Chest: Effort normal and breath sounds normal.  Lymphadenopathy:    She has cervical adenopathy.  Neurological: She is alert and oriented to person, place, and time.  Skin: Skin is warm and dry.  Psychiatric: She has a normal mood and affect. Her behavior is normal. Judgment and thought content normal.      Assessment & Plan:  1. Fever,  unspecified fever cause - POCT Influenza A/B  2. Swollen lymph nodes - HIV antibody (with reflex) - Hepatic Function Panel - RPR - CBC with Differential/Platelet - CMP14+EGFR  Unremarkable physical exam. Will screen for STD, check CBC, and CMP.  -Treat inflammation with Prednisone 40 mg x 5 days with breakfast. -Will follow-up with results of labs. -If you develop any fever, please return for further evaluation.  Carroll Sage. Kenton Kingfisher, MSN, FNP-C [facility]

## 2016-10-02 NOTE — Patient Instructions (Addendum)
I will notify you via MyChart of any abnormal lab results.  If any requires additional follow-up, I will contact you via phone.   Start Prednisone 40 mg x 5 days for swollen lymph nodes.  If you develop a fever, please call the office immediately for further instruction.   IF you received an x-ray today, you will receive an invoice from [facility]. Please contact [facility] at [phone] with questions or concerns regarding your invoice.   IF you received labwork today, you will receive an invoice from [facility]. Please contact [facility] at [phone] with questions or concerns regarding your invoice.   Our billing staff will not be able to assist you with questions regarding bills from these companies.  You will be contacted with the lab results as soon as they are available. The fastest way to get your results is to activate your My Chart account. Instructions are located on the last page of this paperwork. If you have not heard from [place] regarding the results in 2 weeks, please contact this office.    Lymphadenopathy Introduction Lymphadenopathy refers to swollen or enlarged lymph glands, also called lymph nodes. Lymph glands are part of your body's defense (immune) system, which protects the body from infections, germs, and diseases. Lymph glands are found in many locations in your body, including the neck, underarm, and groin. Many things can cause lymph glands to become enlarged. When your immune system responds to germs, such as viruses or bacteria, infection-fighting cells and fluid build up. This causes the glands to grow in size. Usually, this is not something to worry about. The swelling and any soreness often go away without treatment. However, swollen lymph glands can also be caused by a number of diseases. Your health care provider may do various tests to help determine the cause. If the cause of your swollen lymph glands cannot be found, it is important to  monitor your condition to make sure the swelling goes away. Follow these instructions at home: Watch your condition for any changes. The following actions may help to lessen any discomfort you are feeling:  Get plenty of rest.  Take medicines only as directed by your health care provider. Your health care provider may recommend over-the-counter medicines for pain.  Apply moist heat compresses to the site of swollen lymph nodes as directed by your health care provider. This can help reduce any pain.  Check your lymph nodes daily for any changes.  Keep all follow-up visits as directed by your health care provider. This is important. Contact a health care provider if:  Your lymph nodes are still swollen after 2 weeks.  Your swelling increases or spreads to other areas.  Your lymph nodes are hard, seem fixed to the skin, or are growing rapidly.  Your skin over the lymph nodes is red and inflamed.  You have a fever.  You have chills.  You have fatigue.  You develop a sore throat.  You have abdominal pain.  You have weight loss.  You have night sweats. Get help right away if:  You notice fluid leaking from the area of the enlarged lymph node.  You have severe pain in any area of your body.  You have chest pain.  You have shortness of breath. This information is not intended to replace advice given to you by your health care provider. Make sure you discuss any questions you have with your health care provider. Document Released: 05/29/2008 Document Revised: 01/26/2016 Document Reviewed: 03/25/2014  2017  Elsevier

## 2016-10-03 LAB — CBC WITH DIFFERENTIAL/PLATELET
Basophils Absolute: 0 10*3/uL (ref 0.0–0.2)
Basos: 0 %
EOS (ABSOLUTE): 0.1 10*3/uL (ref 0.0–0.4)
Eos: 1 %
Hematocrit: 41.7 % (ref 34.0–46.6)
Hemoglobin: 13.5 g/dL (ref 11.1–15.9)
Immature Grans (Abs): 0 10*3/uL (ref 0.0–0.1)
Immature Granulocytes: 0 %
Lymphocytes Absolute: 3 10*3/uL (ref 0.7–3.1)
Lymphs: 32 %
MCH: 27.3 pg (ref 26.6–33.0)
MCHC: 32.4 g/dL (ref 31.5–35.7)
MCV: 84 fL (ref 79–97)
Monocytes Absolute: 0.8 10*3/uL (ref 0.1–0.9)
Monocytes: 8 %
Neutrophils Absolute: 5.6 10*3/uL (ref 1.4–7.0)
Neutrophils: 59 %
Platelets: 356 10*3/uL (ref 150–379)
RBC: 4.95 x10E6/uL (ref 3.77–5.28)
RDW: 15.7 % — ABNORMAL HIGH (ref 12.3–15.4)
WBC: 9.5 10*3/uL (ref 3.4–10.8)

## 2016-10-03 LAB — CMP14+EGFR
ALT: 15 IU/L (ref 0–32)
AST: 16 IU/L (ref 0–40)
Albumin/Globulin Ratio: 1.3 (ref 1.2–2.2)
Albumin: 4.4 g/dL (ref 3.5–5.5)
Alkaline Phosphatase: 127 IU/L — ABNORMAL HIGH (ref 39–117)
BUN/Creatinine Ratio: 13 (ref 9–23)
BUN: 8 mg/dL (ref 6–20)
Bilirubin Total: 0.2 mg/dL (ref 0.0–1.2)
CO2: 21 mmol/L (ref 18–29)
Calcium: 9.3 mg/dL (ref 8.7–10.2)
Chloride: 99 mmol/L (ref 96–106)
Creatinine, Ser: 0.61 mg/dL (ref 0.57–1.00)
GFR calc Af Amer: 146 mL/min/{1.73_m2} (ref >59)
GFR calc non Af Amer: 126 mL/min/{1.73_m2} (ref >59)
Globulin, Total: 3.3 g/dL (ref 1.5–4.5)
Glucose: 81 mg/dL (ref 65–99)
Potassium: 4 mmol/L (ref 3.5–5.2)
Sodium: 140 mmol/L (ref 134–144)
Total Protein: 7.7 g/dL (ref 6.0–8.5)

## 2016-10-03 LAB — RPR: RPR Ser Ql: NONREACTIVE

## 2016-10-03 LAB — HEPATIC FUNCTION PANEL: Bilirubin, Direct: 0.07 mg/dL (ref 0.00–0.40)

## 2016-10-03 LAB — HIV ANTIBODY (ROUTINE TESTING W REFLEX): HIV Screen 4th Generation wRfx: NONREACTIVE

## 2016-12-20 ENCOUNTER — Emergency Department (HOSPITAL_COMMUNITY)
Admission: EM | Admit: 2016-12-20 | Discharge: 2016-12-20 | Disposition: A | Discharge status: Home / Self Care | Payer: PRIVATE HEALTH INSURANCE | Attending: Emergency Medicine | Admitting: Emergency Medicine

## 2016-12-20 ENCOUNTER — Encounter (HOSPITAL_COMMUNITY): Payer: Self-pay | Admitting: *Deleted

## 2016-12-20 ENCOUNTER — Emergency Department (HOSPITAL_COMMUNITY): Payer: PRIVATE HEALTH INSURANCE

## 2016-12-20 DIAGNOSIS — R0981 Nasal congestion: Secondary | ICD-10-CM | POA: Insufficient documentation

## 2016-12-20 DIAGNOSIS — R55 Syncope and collapse: Secondary | ICD-10-CM | POA: Diagnosis not present

## 2016-12-20 DIAGNOSIS — Z79899 Other long term (current) drug therapy: Secondary | ICD-10-CM | POA: Insufficient documentation

## 2016-12-20 DIAGNOSIS — Z87891 Personal history of nicotine dependence: Secondary | ICD-10-CM | POA: Insufficient documentation

## 2016-12-20 DIAGNOSIS — R51 Headache: Secondary | ICD-10-CM | POA: Insufficient documentation

## 2016-12-20 DIAGNOSIS — R519 Headache, unspecified: Secondary | ICD-10-CM

## 2016-12-20 DIAGNOSIS — R42 Dizziness and giddiness: Secondary | ICD-10-CM | POA: Diagnosis present

## 2016-12-20 DIAGNOSIS — J45909 Unspecified asthma, uncomplicated: Secondary | ICD-10-CM | POA: Insufficient documentation

## 2016-12-20 LAB — URINALYSIS, ROUTINE W REFLEX MICROSCOPIC
Bilirubin Urine: NEGATIVE
GLUCOSE, UA: NEGATIVE mg/dL
HGB URINE DIPSTICK: NEGATIVE
Ketones, ur: 5 mg/dL — AB
NITRITE: NEGATIVE
Protein, ur: NEGATIVE mg/dL
SPECIFIC GRAVITY, URINE: 1.019 (ref 1.005–1.030)
pH: 6 (ref 5.0–8.0)

## 2016-12-20 LAB — CBC WITH DIFFERENTIAL/PLATELET
Basophils Absolute: 0 10*3/uL (ref 0.0–0.1)
Basophils Relative: 0 %
EOS PCT: 0 %
Eosinophils Absolute: 0 10*3/uL (ref 0.0–0.7)
HEMATOCRIT: 39.7 % (ref 36.0–46.0)
Hemoglobin: 13.3 g/dL (ref 12.0–15.0)
LYMPHS ABS: 1.6 10*3/uL (ref 0.7–4.0)
LYMPHS PCT: 9 %
MCH: 28.6 pg (ref 26.0–34.0)
MCHC: 33.5 g/dL (ref 30.0–36.0)
MCV: 85.4 fL (ref 78.0–100.0)
MONO ABS: 1.2 10*3/uL — AB (ref 0.1–1.0)
Monocytes Relative: 7 %
NEUTROS ABS: 14.2 10*3/uL — AB (ref 1.7–7.7)
Neutrophils Relative %: 84 %
PLATELETS: 284 10*3/uL (ref 150–400)
RBC: 4.65 MIL/uL (ref 3.87–5.11)
RDW: 14.5 % (ref 11.5–15.5)
WBC: 17 10*3/uL — ABNORMAL HIGH (ref 4.0–10.5)

## 2016-12-20 LAB — BASIC METABOLIC PANEL
Anion gap: 7 (ref 5–15)
BUN: 6 mg/dL (ref 6–20)
CO2: 25 mmol/L (ref 22–32)
Calcium: 8.7 mg/dL — ABNORMAL LOW (ref 8.9–10.3)
Chloride: 104 mmol/L (ref 101–111)
Creatinine, Ser: 0.67 mg/dL (ref 0.44–1.00)
GFR calc Af Amer: >60 mL/min (ref >60)
GLUCOSE: 111 mg/dL — AB (ref 65–99)
POTASSIUM: 3.8 mmol/L (ref 3.5–5.1)
Sodium: 136 mmol/L (ref 135–145)

## 2016-12-20 LAB — PREGNANCY, URINE: Preg Test, Ur: NEGATIVE

## 2016-12-20 LAB — D-DIMER, QUANTITATIVE: D-Dimer, Quant: 0.44 ug/mL-FEU (ref 0.00–0.50)

## 2016-12-20 LAB — TROPONIN I: Troponin I: <0.03 ng/mL (ref <0.03)

## 2016-12-20 IMAGING — DX DG CHEST 2V
2 series · 2 of 2 positions shown · non-contrast
Comparison: None.

CLINICAL DATA: Dry cough,sob and congestion/dizziness/hx asthma as
a child/ex smoker x years

EXAM:
CHEST  2 VIEW

[chest pa]
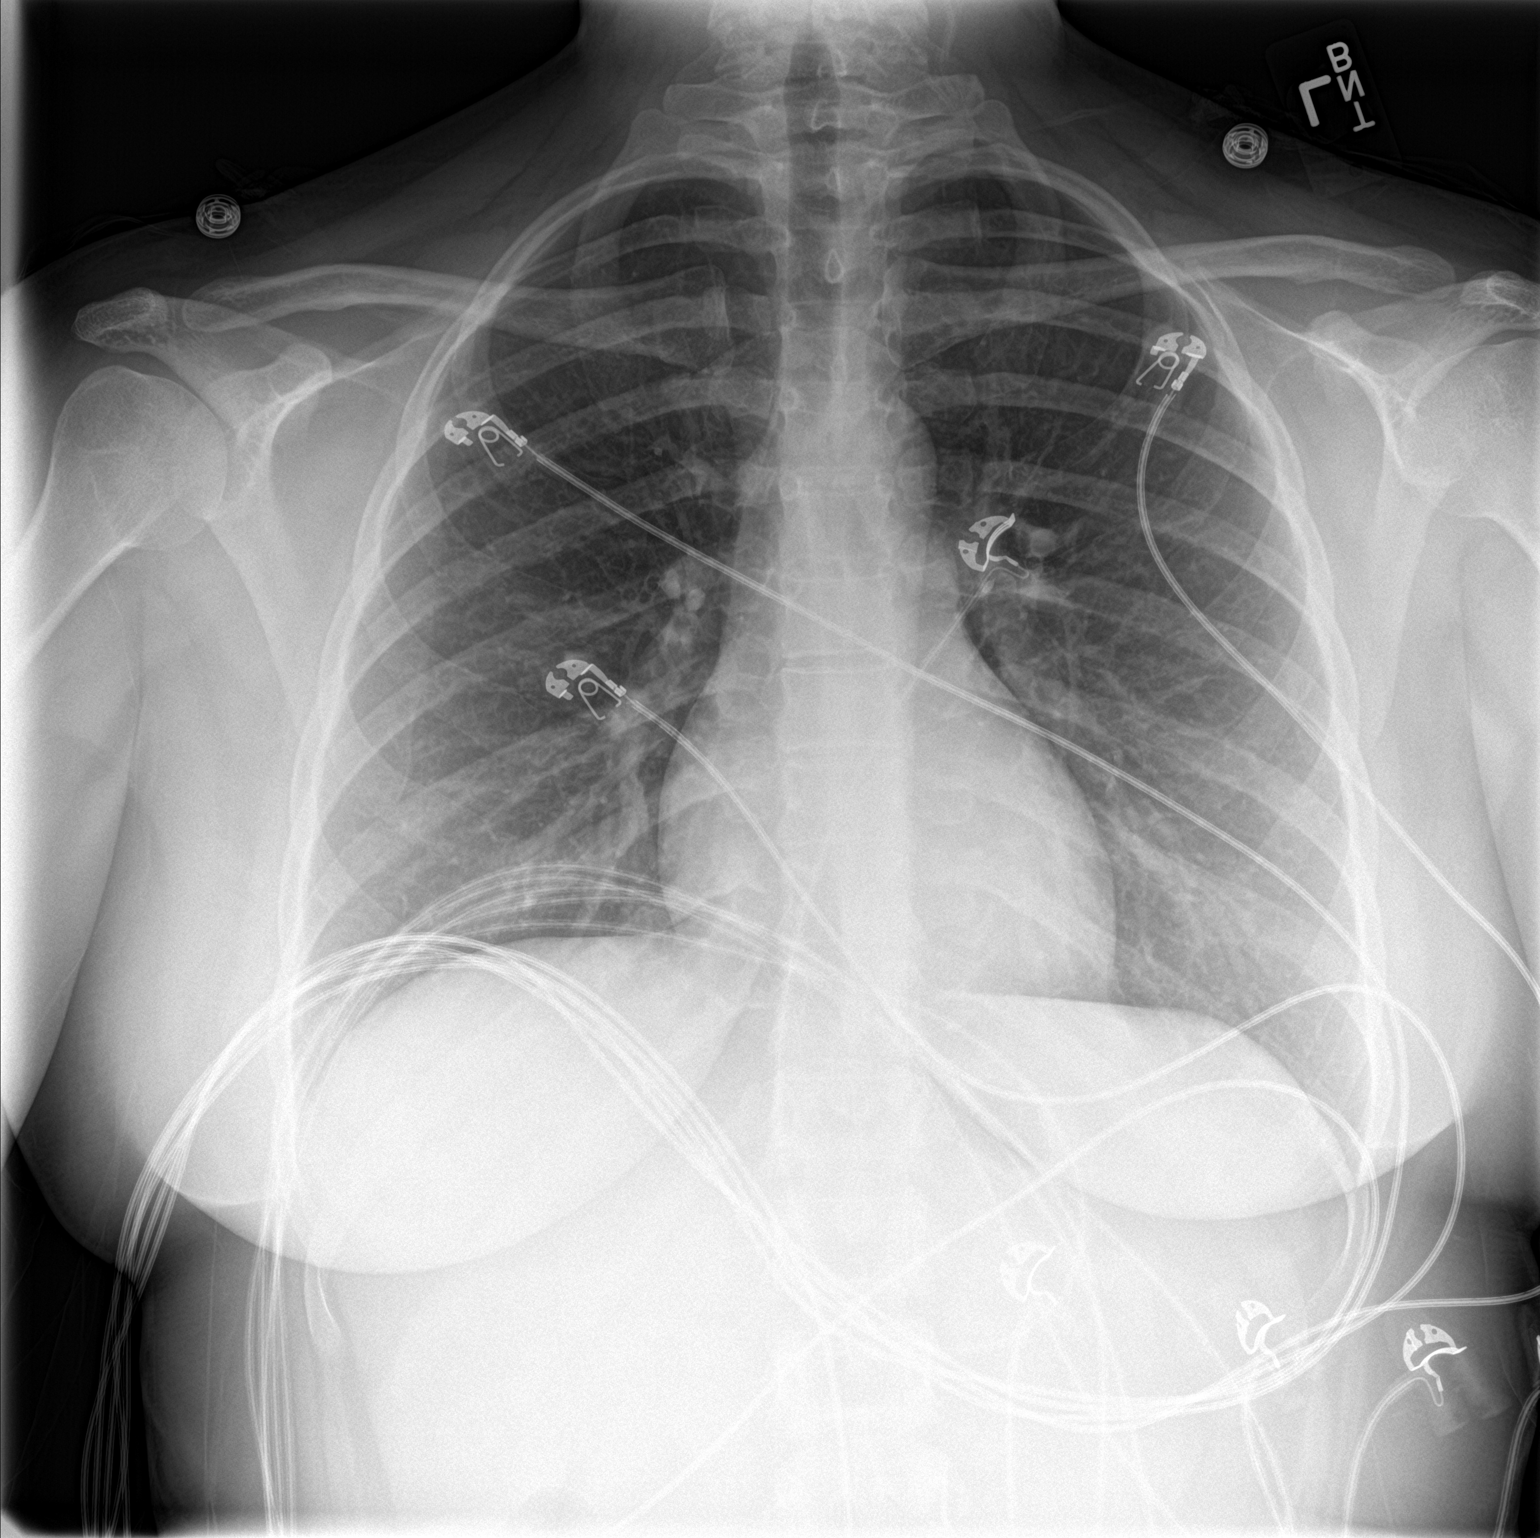

[chest lat]
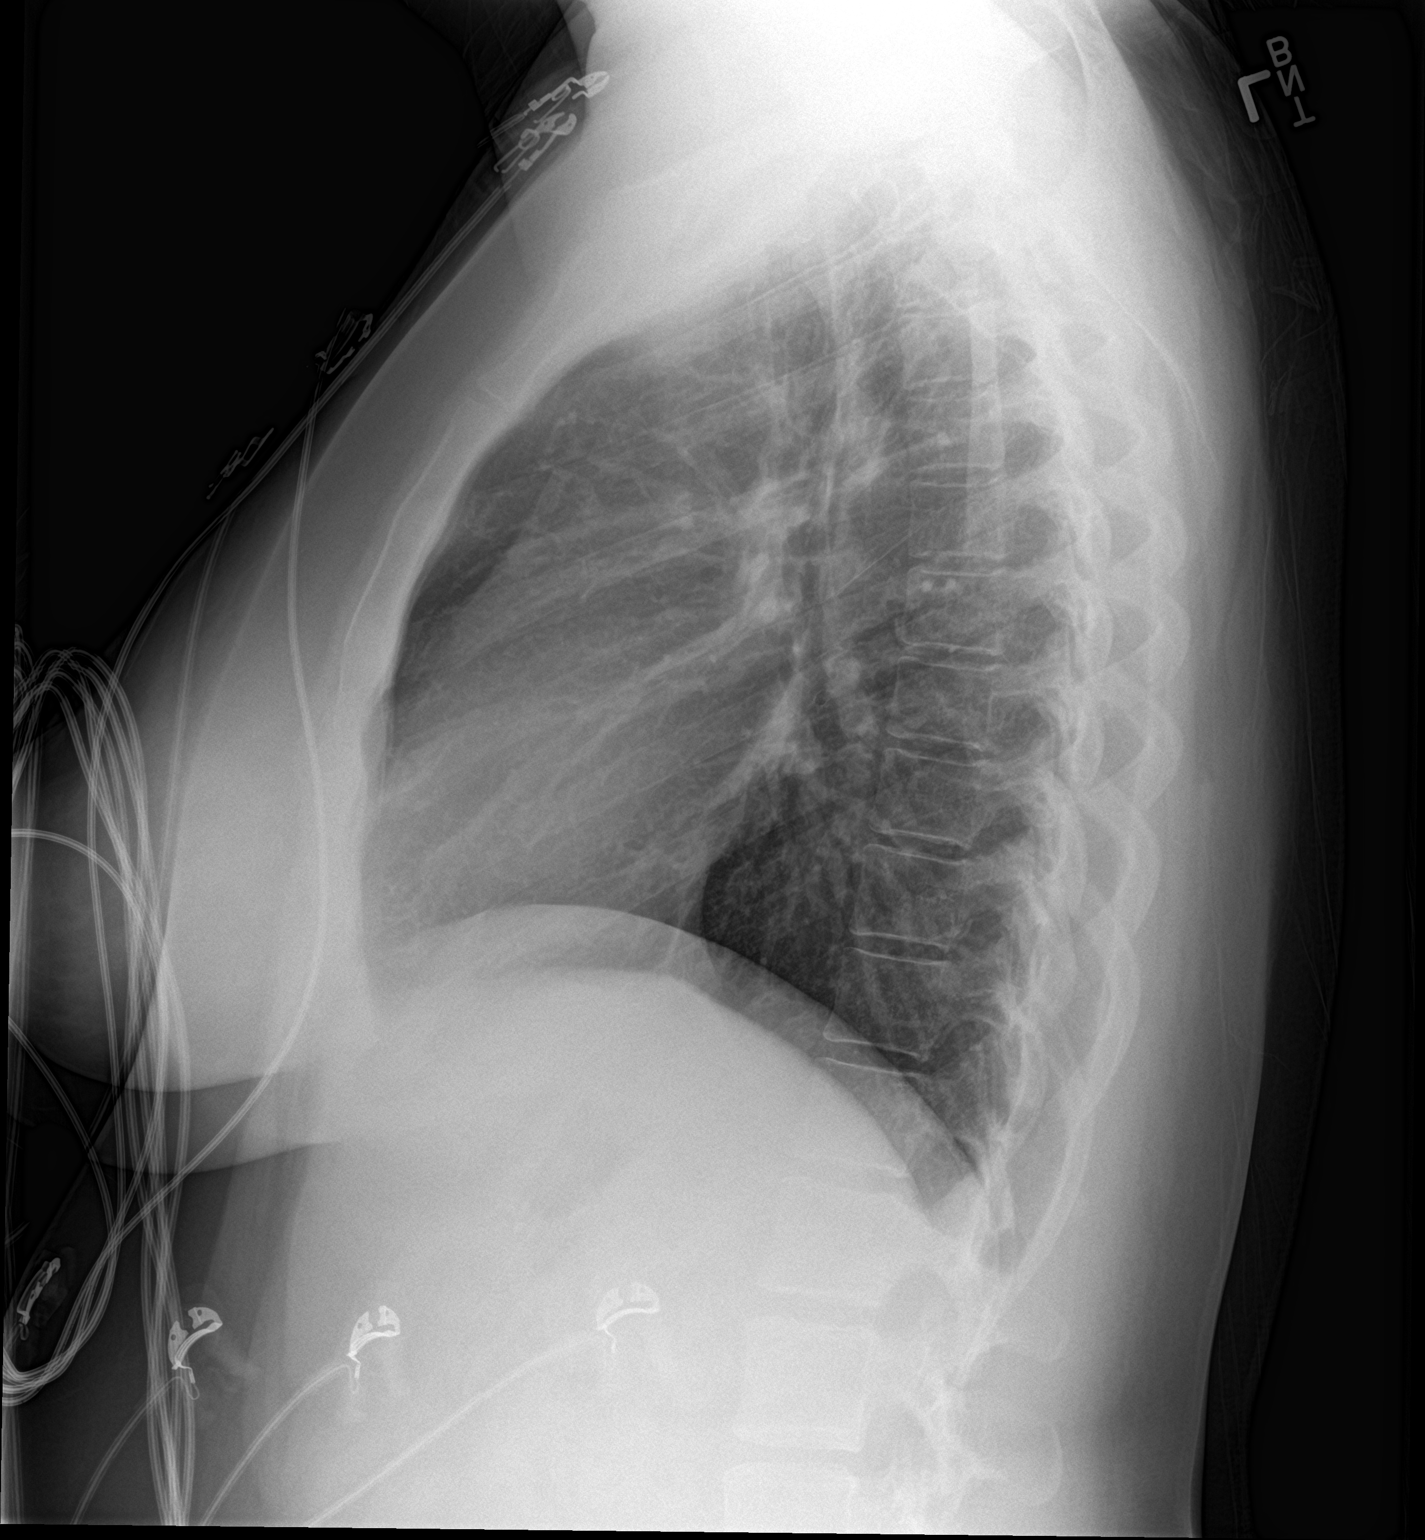

[2 of 2 positions shown; findings below may reference images not displayed]

FINDINGS: Normal mediastinum and cardiac silhouette. Normal pulmonary
vasculature. No evidence of effusion, infiltrate, or pneumothorax.
No acute bony abnormality.
IMPRESSION: No acute cardiopulmonary process.

## 2016-12-20 IMAGING — CT CT HEAD W/O CM
3 series · 16 of 46 positions shown, 19 images · non-contrast
Comparison: CT head [DATE]

CLINICAL DATA: Lightheaded.  Headache

EXAM:
CT HEAD WITHOUT CONTRAST
TECHNIQUE: Contiguous axial images were obtained from the base of the skull
through the vertex without intravenous contrast.

[Series 2: head wo · axial · 0.42mm/px · z∈[+72,+192]mm · 10 of 29 slices shown, 13 images]
[im 3/29  brain]
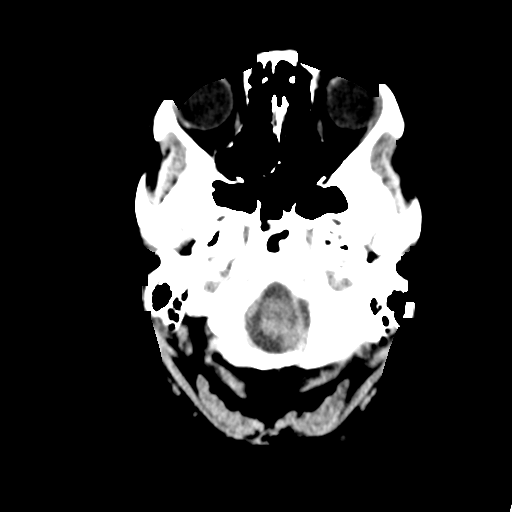
[im 3/29  bone]
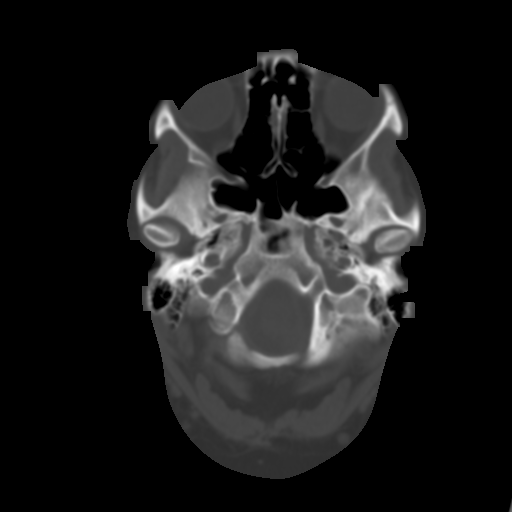
[im 6/29  brain]
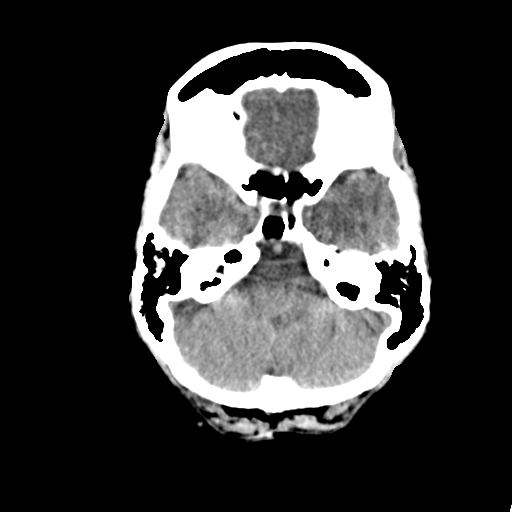
[im 8/29  brain]
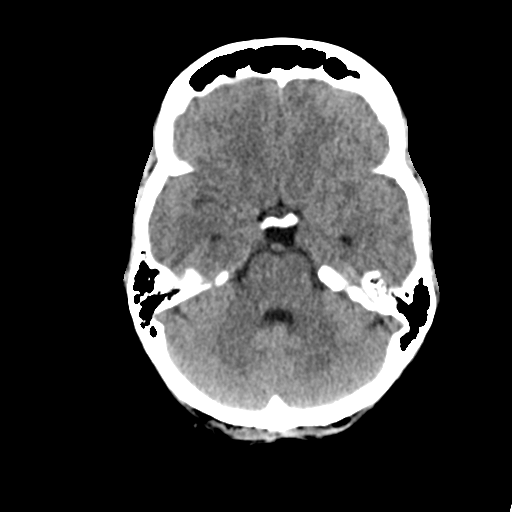
[im 11/29  brain]
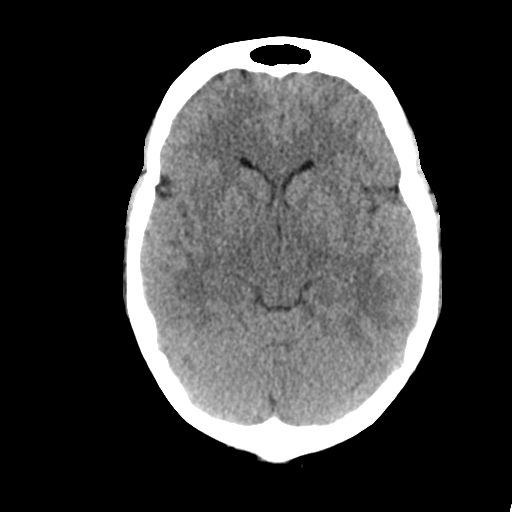
[im 14/29  brain]
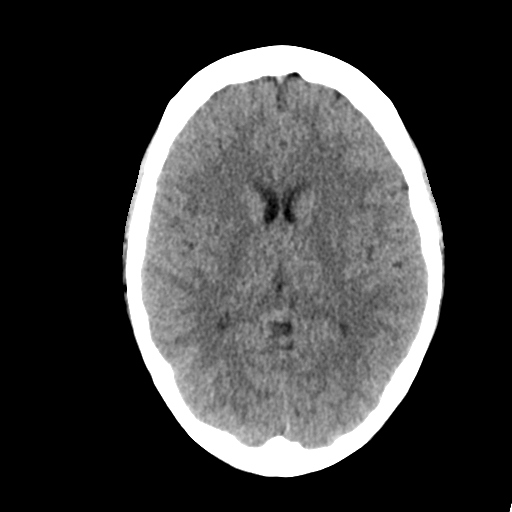
[im 14/29  bone]
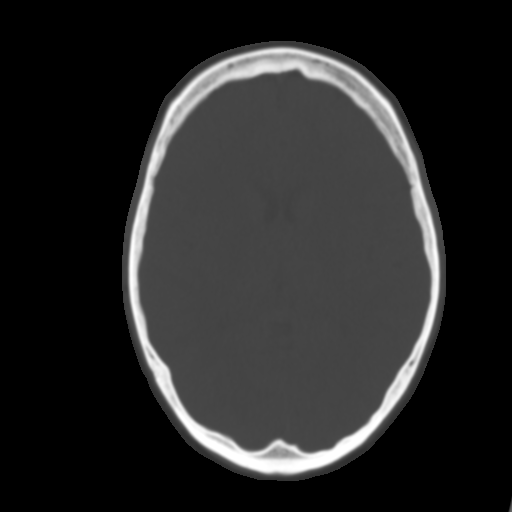
[im 16/29  brain]
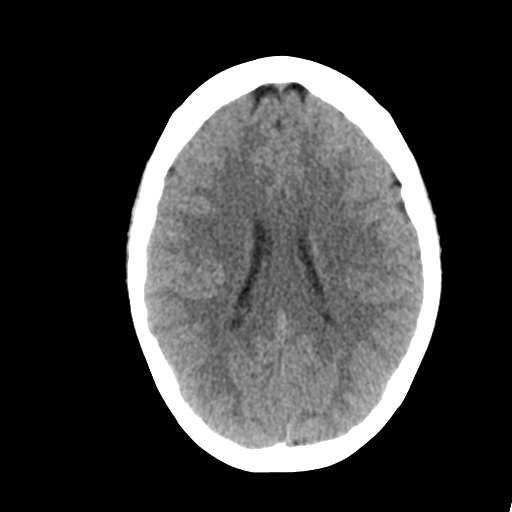
[im 19/29  brain]
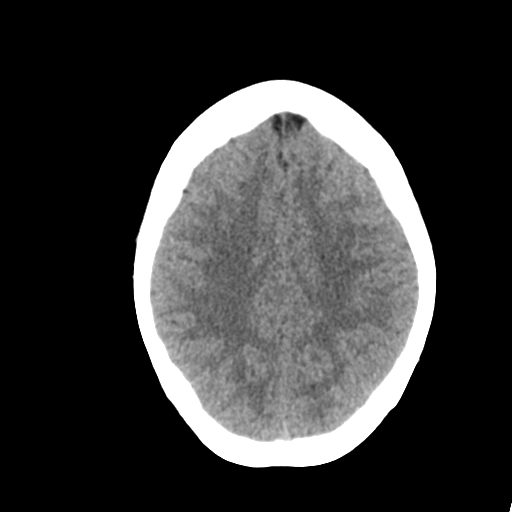
[im 22/29  brain]
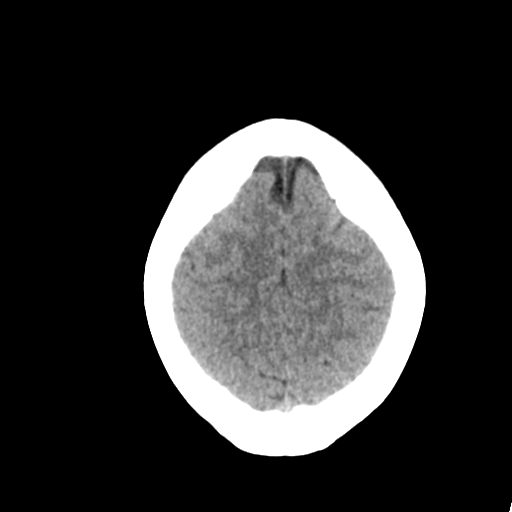
[im 24/29  brain]
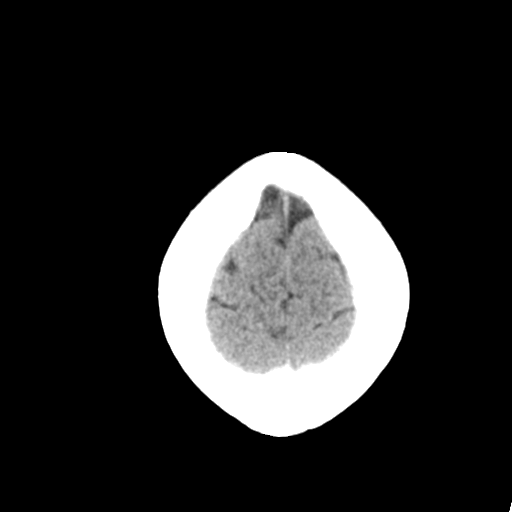
[im 24/29  bone]
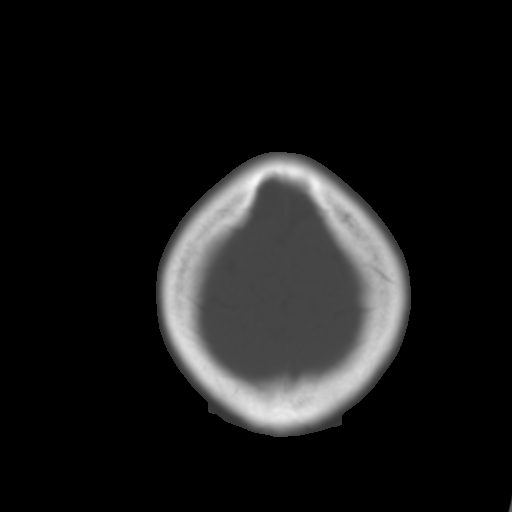
[im 27/29  brain]
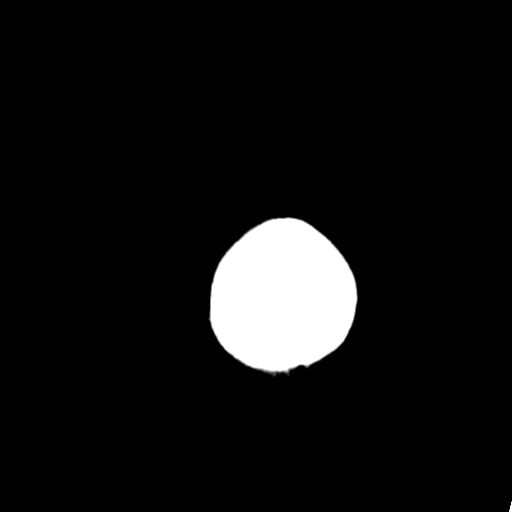

[Series 4: coronal soft tissue · coronal · 0.29mm/px · 3 of 77 slices shown]
[im 26/77  brain]
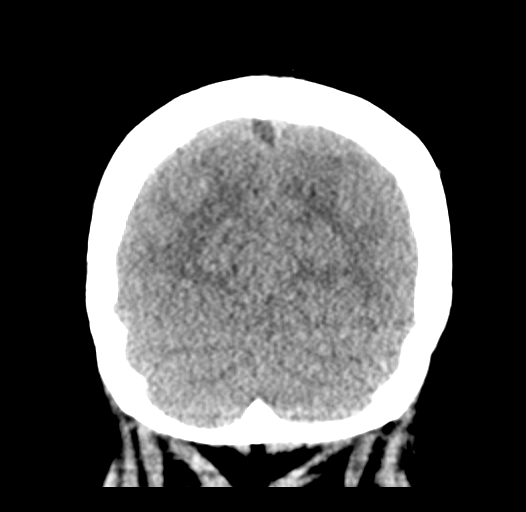
[im 34/77  brain]
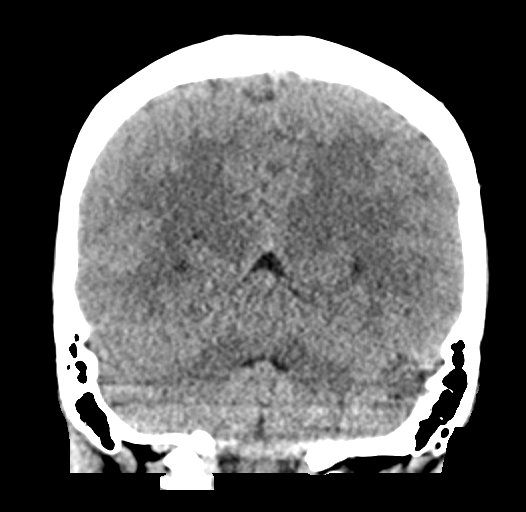
[im 43/77  brain]
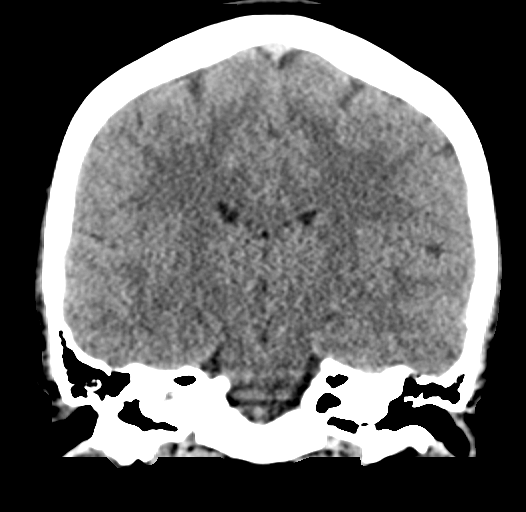

[Series 5: sagittal soft tissue · sagittal · 0.33mm/px · 3 of 51 slices shown]
[im 17/51  brain]
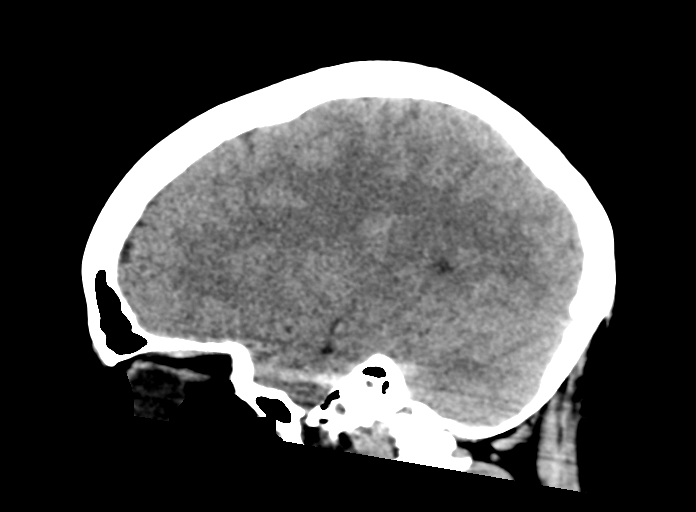
[im 26/51  brain]
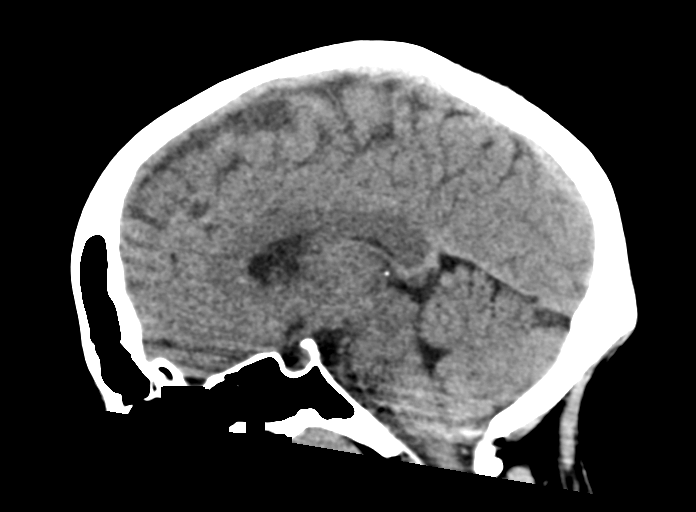
[im 34/51  brain]
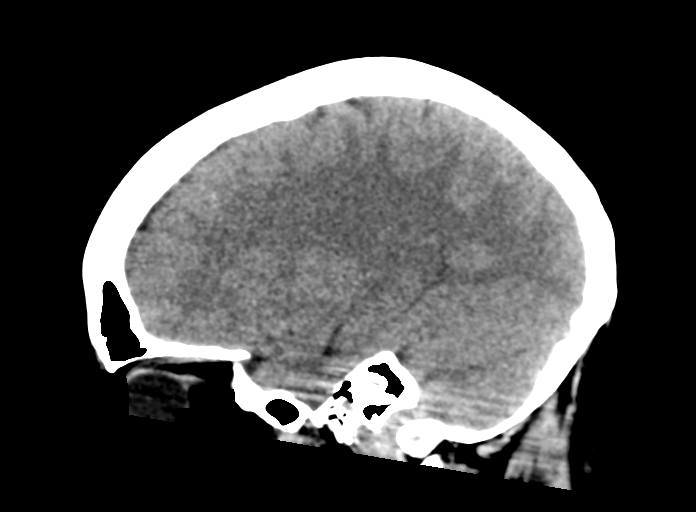

[16 of 46 positions shown; findings below may reference images not displayed]

FINDINGS: Brain: No evidence of acute infarction, hemorrhage, hydrocephalus,
extra-axial collection or mass lesion/mass effect.

Vascular: No hyperdense vessel or unexpected calcification.

Skull: Negative

Sinuses/Orbits: Negative

Other: None
IMPRESSION: Negative CT head

## 2016-12-20 MED ORDER — METOCLOPRAMIDE HCL 10 MG PO TABS: 10.0000 mg | ORAL_TABLET | Freq: Four times a day (QID) | ORAL | 0 refills | Status: DC | PRN

## 2016-12-20 MED ORDER — ACETAMINOPHEN 325 MG PO TABS
650.0000 mg | ORAL_TABLET | Freq: Once | ORAL | Status: AC
  Administered 2016-12-20: 650 mg via ORAL
  Filled 2016-12-20: qty 2

## 2016-12-20 MED ORDER — KETOROLAC TROMETHAMINE 60 MG/2ML IM SOLN
60.0000 mg | Freq: Once | INTRAMUSCULAR | Status: AC
  Administered 2016-12-20: 60 mg via INTRAMUSCULAR
  Filled 2016-12-20: qty 2

## 2016-12-20 MED ORDER — DEXAMETHASONE 4 MG PO TABS
8.0000 mg | ORAL_TABLET | Freq: Once | ORAL | Status: AC
  Administered 2016-12-20: 8 mg via ORAL
  Filled 2016-12-20: qty 2

## 2016-12-20 NOTE — Discharge Instructions (Signed)
Take over the counter tylenol, ibuprofen and benadryl, as directed on packaging, with the prescription given to you today, as needed for headache. Increase your fluid intake for the next several days, as discussed.  Call your regular medical doctor today to schedule a follow up appointment within the next 2 to 3 days.  Return to the Emergency Department immediately sooner if worsening.

## 2016-12-20 NOTE — ED Provider Notes (Signed)
[facility] DEPT Provider Note   CSN: [id number] Arrival date & time: 12/20/16  0702     History   Chief Complaint Chief Complaint  Patient presents with  . Dizziness    HPI Nancy Novak is a 26 y.o. female.  HPI  Pt was seen at [facility]. Per pt, c/o gradual onset and persistence of constant "dizziness" that began approximately midnight. Pt describes the dizziness as "feeling like I'm going to pass out."  Pt states she also has had a "aching headache" since yesterday afternoon. States the headache began while she was sitting in a car waiting for her husband, who was shopping in a store. Pt also c/o mild cough and sinus congestion for the past several days. Denies fevers, no injury, no  rash, no sore throat, no abd pain, no N/V/D, no CP/SOB, no palpitations, no neck pain, no visual changes, no focal motor weakness, no tingling/numbness in extremities, no ataxia, no slurred speech, no facial droop. Denies headache was sudden or maximal in onset or at any time.      Past Medical History:  Diagnosis Date  . Asthma     There are no active problems to display for this patient.   Past Surgical History:  Procedure Laterality Date  . CYST EXCISION      OB History    Gravida Para Term Preterm AB Living   1 0 0 0 1 0   SAB TAB Ectopic Multiple Live Births   1 0 0 0        Obstetric Comments   Had an early pregnancy loss last summer       Home Medications    Prior to Admission medications   Medication Sig Start Date End Date Taking? Authorizing Provider  norgestimate-ethinyl estradiol (ORTHO-CYCLEN,SPRINTEC,PREVIFEM) 0.25-35 MG-MCG tablet Take 1 tablet by mouth daily.    Historical Provider, MD  predniSONE (DELTASONE) 20 MG tablet Take 2 tablets (40 mg total) by mouth daily with breakfast. 10/02/16   Sedalia Muta, FNP    Family History Family History  Problem Relation Age of Onset  . Hypertension Mother   . Hypertension Paternal Grandmother   . Cancer  Paternal Grandfather     lung    Social History Social History  Substance Use Topics  . Smoking status: Former Research scientist (life sciences)  . Smokeless tobacco: Never Used  . Alcohol use No     Allergies   Bee venom; Other; and Codeine   Review of Systems Review of Systems ROS: Statement: All systems negative except as marked or noted in the HPI; Constitutional: Negative for fever and chills. ; ; Eyes: Negative for eye pain, redness and discharge. ; ; ENMT: Negative for ear pain, hoarseness, sore throat. +nasal congestion, sinus pressure and rhinorrhea. ; ; Cardiovascular: Negative for chest pain, palpitations, diaphoresis, dyspnea and peripheral edema. ; ; Respiratory: Negative for cough, wheezing and stridor. ; ; Gastrointestinal: Negative for nausea, vomiting, diarrhea, abdominal pain, blood in stool, hematemesis, jaundice and rectal bleeding. . ; ; Genitourinary: Negative for dysuria, flank pain and hematuria. ; ; Musculoskeletal: Negative for back pain and neck pain. Negative for swelling and trauma.; ; Skin: Negative for pruritus, rash, abrasions, blisters, bruising and skin lesion.; ; Neuro: +headache, lightheadedness. Negative for neck stiffness. Negative for weakness, altered level of consciousness, altered mental status, extremity weakness, paresthesias, involuntary movement, seizure and syncope.       Physical Exam Updated Vital Signs BP 135/78   Pulse (!) 113   Temp 98.5 F (  36.9 C) (Oral)   Resp 18   Ht 5\' 5"  (1.651 m)   Wt 213 lb (96.6 kg)   LMP 11/22/2016   SpO2 96%   BMI 35.45 kg/m    07:26 Orthostatic Vital Signs MM  Orthostatic Lying   BP- Lying: 103/57  Pulse- Lying: 100      Orthostatic Sitting  BP- Sitting: 107/58  Pulse- Sitting: 101      Orthostatic Standing at 0 minutes  BP- Standing at 0 minutes: 110/58  Pulse- Standing at 0 minutes: 115     Physical Exam 0720: Physical examination:  Nursing notes reviewed; Vital signs and O2 SAT reviewed;  Constitutional:  Well developed, Well nourished, Well hydrated, In no acute distress; Head:  Normocephalic, atraumatic; Eyes: EOMI, PERRL, No scleral icterus; ENMT: TM's clear bilat. +edemetous nasal turbinates bilat with clear rhinorrhea.Mouth and pharynx without lesions. No tonsillar exudates. No intra-oral edema. No submandibular or sublingual edema. No hoarse voice, no drooling, no stridor. No pain with manipulation of larynx. No trismus. Mouth and pharynx normal, Mucous membranes moist; Neck: Supple, Full range of motion, No lymphadenopathy. No meningeal signs.; Cardiovascular: Tachycardic rate and rhythm, No gallop; Respiratory: Breath sounds clear & equal bilaterally, No wheezes.  Speaking full sentences with ease, Normal respiratory effort/excursion; Chest: Nontender, Movement normal; Abdomen: Soft, Nontender, Nondistended, Normal bowel sounds; Genitourinary: No CVA tenderness; Spine:  No midline CS, TS, LS tenderness.;; Extremities: Pulses normal, No tenderness, No edema, No calf edema or asymmetry.; Neuro: AA&Ox3, Major CN grossly intact. No facial droop. Speech clear. No gross focal motor or sensory deficits in extremities.; Skin: Color normal, Warm, Dry.   ED Treatments / Results  Labs (all labs ordered are listed, but only abnormal results are displayed)   EKG  EKG Interpretation  Date/Time:  Thursday December 20 2016 07:24:40 EDT Ventricular Rate:  102 PR Interval:    QRS Duration: 91 QT Interval:  359 QTC Calculation: 468 R Axis:   75 Text Interpretation:  Sinus tachycardia No old tracing to compare Confirmed by [facility]  MD, Nunzio Cory [phone]) on 12/20/2016 7:41:29 AM       Radiology   Procedures Procedures (including critical care time)  Medications Ordered in ED Medications  ketorolac (TORADOL) injection 60 mg (not administered)  acetaminophen (TYLENOL) tablet 650 mg (not administered)     Initial Impression / Assessment and Plan / ED Course  I have reviewed the triage vital signs and  the nursing notes.  Pertinent labs & imaging results that were available during my care of the patient were reviewed by me and considered in my medical decision making (see chart for details).  MDM Reviewed: previous chart, nursing note and vitals Reviewed previous: labs and ECG Interpretation: labs, ECG, x-ray and CT scan   Results for orders placed or performed during the hospital encounter of 12/20/16  Pregnancy, urine  Result Value Ref Range   Preg Test, Ur NEGATIVE NEGATIVE  Urinalysis, Routine w reflex microscopic  Result Value Ref Range   Color, Urine YELLOW YELLOW   APPearance HAZY (A) CLEAR   Specific Gravity, Urine 1.019 1.005 - 1.030   pH 6.0 5.0 - 8.0   Glucose, UA NEGATIVE NEGATIVE mg/dL   Hgb urine dipstick NEGATIVE NEGATIVE   Bilirubin Urine NEGATIVE NEGATIVE   Ketones, ur 5 (A) NEGATIVE mg/dL   Protein, ur NEGATIVE NEGATIVE mg/dL   Nitrite NEGATIVE NEGATIVE   Leukocytes, UA SMALL (A) NEGATIVE   RBC / HPF 0-5 0 - 5 RBC/hpf   WBC,  UA 0-5 0 - 5 WBC/hpf   Bacteria, UA RARE (A) NONE SEEN   Squamous Epithelial / LPF 6-30 (A) NONE SEEN   Mucous PRESENT   CBC with Differential  Result Value Ref Range   WBC 17.0 (H) 4.0 - 10.5 K/uL   RBC 4.65 3.87 - 5.11 MIL/uL   Hemoglobin 13.3 12.0 - 15.0 g/dL   HCT 39.7 36.0 - 46.0 %   MCV 85.4 78.0 - 100.0 fL   MCH 28.6 26.0 - 34.0 pg   MCHC 33.5 30.0 - 36.0 g/dL   RDW 14.5 11.5 - 15.5 %   Platelets 284 150 - 400 K/uL   Neutrophils Relative % 84 %   Neutro Abs 14.2 (H) 1.7 - 7.7 K/uL   Lymphocytes Relative 9 %   Lymphs Abs 1.6 0.7 - 4.0 K/uL   Monocytes Relative 7 %   Monocytes Absolute 1.2 (H) 0.1 - 1.0 K/uL   Eosinophils Relative 0 %   Eosinophils Absolute 0.0 0.0 - 0.7 K/uL   Basophils Relative 0 %   Basophils Absolute 0.0 0.0 - 0.1 K/uL  Basic metabolic panel  Result Value Ref Range   Sodium 136 135 - 145 mmol/L   Potassium 3.8 3.5 - 5.1 mmol/L   Chloride 104 101 - 111 mmol/L   CO2 25 22 - 32 mmol/L   Glucose,  Bld 111 (H) 65 - 99 mg/dL   BUN 6 6 - 20 mg/dL   Creatinine, Ser 0.67 0.44 - 1.00 mg/dL   Calcium 8.7 (L) 8.9 - 10.3 mg/dL   GFR calc non Af Amer >60 >60 mL/min   GFR calc Af Amer >60 >60 mL/min   Anion gap 7 5 - 15  Troponin I  Result Value Ref Range   Troponin I <0.03 <0.03 ng/mL  D-dimer, quantitative  Result Value Ref Range   D-Dimer, Quant 0.44 0.00 - 0.50 ug/mL-FEU   Dg Chest 2 View Result Date: 12/20/2016 CLINICAL DATA:  Dry cough,sob and congestion/dizziness/hx asthma as a child/ex smoker x years EXAM: CHEST  2 VIEW COMPARISON:  None. FINDINGS: Normal mediastinum and cardiac silhouette. Normal pulmonary vasculature. No evidence of effusion, infiltrate, or pneumothorax. No acute bony abnormality. IMPRESSION: No acute cardiopulmonary process. Electronically Signed   By: Suzy Bouchard M.D.   On: 12/20/2016 09:40   Ct Head Wo Contrast Result Date: 12/20/2016 CLINICAL DATA:  Lightheaded.  Headache EXAM: CT HEAD WITHOUT CONTRAST TECHNIQUE: Contiguous axial images were obtained from the base of the skull through the vertex without intravenous contrast. COMPARISON:  CT head 08/23/2010 FINDINGS: Brain: No evidence of acute infarction, hemorrhage, hydrocephalus, extra-axial collection or mass lesion/mass effect. Vascular: No hyperdense vessel or unexpected calcification. Skull: Negative Sinuses/Orbits: Negative Other: None IMPRESSION: Negative CT head Electronically Signed   By: Franchot Gallo M.D.   On: 12/20/2016 08:49     0720:  SAH ruled out per [facility] Rule.   5366:  Pt afebrile. Workup reassuring. Pt not orthostatic and BP's are c/w previous in [employer] records. Pt has tol PO well without N/V. Abd remains benign, resps easy, NAD. Pt states she is ready to go home now. Tx symptomatically at this time. Dx and testing d/w pt.  Questions answered.  Verb understanding, agreeable to d/c home with outpt f/u.      Final Clinical Impressions(s) / ED Diagnoses   Final  diagnoses:  None    New Prescriptions New Prescriptions   No medications on file     Francine Graven,  DO 12/23/16 1643

## 2016-12-20 NOTE — ED Notes (Signed)
Pt tolerating fluids.   

## 2016-12-20 NOTE — ED Triage Notes (Signed)
Pt c/o dizziness that started around midnight. Pt reports she feels as if she is going to pass out. Pt had headache that started yesterday and hasn't went away. Pt used Excedrin Migraine without relief. Pt also reports nausea, no vomiting.

## 2016-12-20 NOTE — ED Notes (Signed)
Pt made aware to return if symptoms worsen or if any life threatening symptoms occur.   

## 2017-03-20 ENCOUNTER — Other Ambulatory Visit (INDEPENDENT_AMBULATORY_CARE_PROVIDER_SITE_OTHER): Payer: PRIVATE HEALTH INSURANCE

## 2017-03-20 ENCOUNTER — Other Ambulatory Visit: Payer: Self-pay | Admitting: Obstetrics & Gynecology

## 2017-03-20 DIAGNOSIS — O3680X0 Pregnancy with inconclusive fetal viability, not applicable or unspecified: Secondary | ICD-10-CM | POA: Diagnosis not present

## 2017-03-20 NOTE — Progress Notes (Signed)
[place] No IUP visualized,homogeneous anteverted uterus,wnl, EEC 2.4 mm,normal right ovary,simple left corpus luteal cyst 3 x 2.9 x 2.7 cm,Dr.Eure reviewed images during ultrasound

## 2017-04-22 DIAGNOSIS — R1011 Right upper quadrant pain: Secondary | ICD-10-CM | POA: Insufficient documentation

## 2017-04-22 DIAGNOSIS — R1084 Generalized abdominal pain: Secondary | ICD-10-CM | POA: Insufficient documentation

## 2017-04-23 ENCOUNTER — Encounter (HOSPITAL_COMMUNITY)
Admission: RE | Admit: 2017-04-23 | Discharge: 2017-04-23 | Disposition: A | Discharge status: Home / Self Care | Payer: PRIVATE HEALTH INSURANCE | Source: Ambulatory Visit | Attending: General Surgery | Admitting: General Surgery

## 2017-04-23 ENCOUNTER — Encounter: Payer: Self-pay | Admitting: General Surgery

## 2017-04-23 ENCOUNTER — Ambulatory Visit (INDEPENDENT_AMBULATORY_CARE_PROVIDER_SITE_OTHER): Payer: PRIVATE HEALTH INSURANCE | Admitting: General Surgery

## 2017-04-23 VITALS — BP 123/79 | HR 85 | Temp 98.3°F | Resp 18 | Ht 65.0 in | Wt 215.0 lb

## 2017-04-23 DIAGNOSIS — K811 Chronic cholecystitis: Secondary | ICD-10-CM

## 2017-04-23 NOTE — Patient Instructions (Signed)
Nancy Novak  04/23/2017     @PREFPERIOPPHARMACY @   Your procedure is scheduled on  04/26/2017   Report to [facility] at  830   A.M.  Call this number if you have problems the morning of surgery:  [phone]   Remember:  Do not eat food or drink liquids after midnight.  Take these medicines the morning of surgery with A SIP OF WATER  Reglan.   Do not wear jewelry, make-up or nail polish.  Do not wear lotions, powders, or perfumes, or deoderant.  Do not shave 48 hours prior to surgery.  Men may shave face and neck.  Do not bring valuables to the hospital.  [facility] is not responsible for any belongings or valuables.  Contacts, dentures or bridgework may not be worn into surgery.  Leave your suitcase in the car.  After surgery it may be brought to your room.  For patients admitted to the hospital, discharge time will be determined by your treatment team.  Patients discharged the day of surgery will not be allowed to drive home.   Name and phone number of your driver:   family Special instructions:  None  Please read over the following fact sheets that you were given. Anesthesia Post-op Instructions and Care and Recovery After Surgery       Laparoscopic Cholecystectomy Laparoscopic cholecystectomy is surgery to remove the gallbladder. The gallbladder is a pear-shaped organ that lies beneath the liver on the right side of the body. The gallbladder stores bile, which is a fluid that helps the body to digest fats. Cholecystectomy is often done for inflammation of the gallbladder (cholecystitis). This condition is usually caused by a buildup of gallstones (cholelithiasis) in the gallbladder. Gallstones can block the flow of bile, which can result in inflammation and pain. In severe cases, emergency surgery may be required. This procedure is done though small incisions in your abdomen (laparoscopic surgery). A thin scope with a camera (laparoscope) is inserted  through one incision. Thin surgical instruments are inserted through the other incisions. In some cases, a laparoscopic procedure may be turned into a type of surgery that is done through a larger incision (open surgery). Tell a health care provider about:  Any allergies you have.  All medicines you are taking, including vitamins, herbs, eye drops, creams, and over-the-counter medicines.  Any problems you or family members have had with anesthetic medicines.  Any blood disorders you have.  Any surgeries you have had.  Any medical conditions you have.  Whether you are pregnant or may be pregnant. What are the risks? Generally, this is a safe procedure. However, problems may occur, including:  Infection.  Bleeding.  Allergic reactions to medicines.  Damage to other structures or organs.  A stone remaining in the common bile duct. The common bile duct carries bile from the gallbladder into the small intestine.  A bile leak from the cyst duct that is clipped when your gallbladder is removed.  What happens before the procedure? Staying hydrated Follow instructions from your health care provider about hydration, which may include:  Up to 2 hours before the procedure - you may continue to drink clear liquids, such as water, clear fruit juice, black coffee, and plain tea.  Eating and drinking restrictions Follow instructions from your health care provider about eating and drinking, which may include:  8 hours before the procedure - stop eating heavy meals or foods such as  meat, fried foods, or fatty foods.  6 hours before the procedure - stop eating light meals or foods, such as toast or cereal.  6 hours before the procedure - stop drinking milk or drinks that contain milk.  2 hours before the procedure - stop drinking clear liquids.  Medicines  Ask your health care provider about: ? Changing or stopping your regular medicines. This is especially important if you are taking  diabetes medicines or blood thinners. ? Taking medicines such as aspirin and ibuprofen. These medicines can thin your blood. Do not take these medicines before your procedure if your health care provider instructs you not to.  You may be given antibiotic medicine to help prevent infection. General instructions  Let your health care provider know if you develop a cold or an infection before surgery.  Plan to have someone take you home from the hospital or clinic.  Ask your health care provider how your surgical site will be marked or identified. What happens during the procedure?  To reduce your risk of infection: ? Your health care team will wash or sanitize their hands. ? Your skin will be washed with soap. ? Hair may be removed from the surgical area.  An IV tube may be inserted into one of your veins.  You will be given one or more of the following: ? A medicine to help you relax (sedative). ? A medicine to make you fall asleep (general anesthetic).  A breathing tube will be placed in your mouth.  Your surgeon will make several small cuts (incisions) in your abdomen.  The laparoscope will be inserted through one of the small incisions. The camera on the laparoscope will send images to a TV screen (monitor) in the operating room. This lets your surgeon see inside your abdomen.  Air-like gas will be pumped into your abdomen. This will expand your abdomen to give the surgeon more room to perform the surgery.  Other tools that are needed for the procedure will be inserted through the other incisions. The gallbladder will be removed through one of the incisions.  Your common bile duct may be examined. If stones are found in the common bile duct, they may be removed.  After your gallbladder has been removed, the incisions will be closed with stitches (sutures), staples, or skin glue.  Your incisions may be covered with a bandage (dressing). The procedure may vary among health care  providers and hospitals. What happens after the procedure?  Your blood pressure, heart rate, breathing rate, and blood oxygen level will be monitored until the medicines you were given have worn off.  You will be given medicines as needed to control your pain.  Do not drive for 24 hours if you were given a sedative. This information is not intended to replace advice given to you by your health care provider. Make sure you discuss any questions you have with your health care provider. Document Released: 08/20/2005 Document Revised: 03/11/2016 Document Reviewed: 02/06/2016 Elsevier Interactive Patient Education  2018 [employer].  Laparoscopic Cholecystectomy, Care After This sheet gives you information about how to care for yourself after your procedure. Your health care provider may also give you more specific instructions. If you have problems or questions, contact your health care provider. What can I expect after the procedure? After the procedure, it is common to have:  Pain at your incision sites. You will be given medicines to control this pain.  Mild nausea or vomiting.  Bloating and possible  shoulder pain from the air-like gas that was used during the procedure.  Follow these instructions at home: Incision care   Follow instructions from your health care provider about how to take care of your incisions. Make sure you: ? Wash your hands with soap and water before you change your bandage (dressing). If soap and water are not available, use hand sanitizer. ? Change your dressing as told by your health care provider. ? Leave stitches (sutures), skin glue, or adhesive strips in place. These skin closures may need to be in place for 2 weeks or longer. If adhesive strip edges start to loosen and curl up, you may trim the loose edges. Do not remove adhesive strips completely unless your health care provider tells you to do that.  Do not take baths, swim, or use a hot tub until your  health care provider approves. Ask your health care provider if you can take showers. You may only be allowed to take sponge baths for bathing.  Check your incision area every day for signs of infection. Check for: ? More redness, swelling, or pain. ? More fluid or blood. ? Warmth. ? Pus or a bad smell. Activity  Do not drive or use heavy machinery while taking prescription pain medicine.  Do not lift anything that is heavier than 10 lb (4.5 kg) until your health care provider approves.  Do not play contact sports until your health care provider approves.  Do not drive for 24 hours if you were given a medicine to help you relax (sedative).  Rest as needed. Do not return to work or school until your health care provider approves. General instructions  Take over-the-counter and prescription medicines only as told by your health care provider.  To prevent or treat constipation while you are taking prescription pain medicine, your health care provider may recommend that you: ? Drink enough fluid to keep your urine clear or pale yellow. ? Take over-the-counter or prescription medicines. ? Eat foods that are high in fiber, such as fresh fruits and vegetables, whole grains, and beans. ? Limit foods that are high in fat and processed sugars, such as fried and sweet foods. Contact a health care provider if:  You develop a rash.  You have more redness, swelling, or pain around your incisions.  You have more fluid or blood coming from your incisions.  Your incisions feel warm to the touch.  You have pus or a bad smell coming from your incisions.  You have a fever.  One or more of your incisions breaks open. Get help right away if:  You have trouble breathing.  You have chest pain.  You have increasing pain in your shoulders.  You faint or feel dizzy when you stand.  You have severe pain in your abdomen.  You have nausea or vomiting that lasts for more than one day.  You  have leg pain. This information is not intended to replace advice given to you by your health care provider. Make sure you discuss any questions you have with your health care provider. Document Released: 08/20/2005 Document Revised: 03/10/2016 Document Reviewed: 02/06/2016 Elsevier Interactive Patient Education  2017 [place] Anesthesia, Adult General anesthesia is the use of medicines to make a person "go to sleep" (be unconscious) for a medical procedure. General anesthesia is often recommended when a procedure:  Is long.  Requires you to be still or in an unusual position.  Is major and can cause you to lose  blood.  Is impossible to do without general anesthesia.  The medicines used for general anesthesia are called general anesthetics. In addition to making you sleep, the medicines:  Prevent pain.  Control your blood pressure.  Relax your muscles.  Tell a health care provider about:  Any allergies you have.  All medicines you are taking, including vitamins, herbs, eye drops, creams, and over-the-counter medicines.  Any problems you or family members have had with anesthetic medicines.  Types of anesthetics you have had in the past.  Any bleeding disorders you have.  Any surgeries you have had.  Any medical conditions you have.  Any history of heart or lung conditions, such as heart failure, sleep apnea, or chronic obstructive pulmonary disease (COPD).  Whether you are pregnant or may be pregnant.  Whether you use tobacco, alcohol, marijuana, or street drugs.  Any history of Armed forces logistics/support/administrative officer.  Any history of depression or anxiety. What are the risks? Generally, this is a safe procedure. However, problems may occur, including:  Allergic reaction to anesthetics.  Lung and heart problems.  Inhaling food or liquids from your stomach into your lungs (aspiration).  Injury to nerves.  Waking up during your procedure and being unable to move  (rare).  Extreme agitation or a state of mental confusion (delirium) when you wake up from the anesthetic.  Air in the bloodstream, which can lead to stroke.  These problems are more likely to develop if you are having a major surgery or if you have an advanced medical condition. You can prevent some of these complications by answering all of your health care provider's questions thoroughly and by following all pre-procedure instructions. General anesthesia can cause side effects, including:  Nausea or vomiting  A sore throat from the breathing tube.  Feeling cold or shivery.  Feeling tired, washed out, or achy.  Sleepiness or drowsiness.  Confusion or agitation.  What happens before the procedure? Staying hydrated Follow instructions from your health care provider about hydration, which may include:  Up to 2 hours before the procedure - you may continue to drink clear liquids, such as water, clear fruit juice, black coffee, and plain tea.  Eating and drinking restrictions Follow instructions from your health care provider about eating and drinking, which may include:  8 hours before the procedure - stop eating heavy meals or foods such as meat, fried foods, or fatty foods.  6 hours before the procedure - stop eating light meals or foods, such as toast or cereal.  6 hours before the procedure - stop drinking milk or drinks that contain milk.  2 hours before the procedure - stop drinking clear liquids.  Medicines  Ask your health care provider about: ? Changing or stopping your regular medicines. This is especially important if you are taking diabetes medicines or blood thinners. ? Taking medicines such as aspirin and ibuprofen. These medicines can thin your blood. Do not take these medicines before your procedure if your health care provider instructs you not to. ? Taking new dietary supplements or medicines. Do not take these during the week before your procedure unless  your health care provider approves them.  If you are told to take a medicine or to continue taking a medicine on the day of the procedure, take the medicine with sips of water. General instructions   Ask if you will be going home the same day, the following day, or after a longer hospital stay. ? Plan to have someone take you home. ?  Plan to have someone stay with you for the first 24 hours after you leave the hospital or clinic.  For 3-6 weeks before the procedure, try not to use any tobacco products, such as cigarettes, chewing tobacco, and e-cigarettes.  You may brush your teeth on the morning of the procedure, but make sure to spit out the toothpaste. What happens during the procedure?  You will be given anesthetics through a mask and through an IV tube in one of your veins.  You may receive medicine to help you relax (sedative).  As soon as you are asleep, a breathing tube may be used to help you breathe.  An anesthesia specialist will stay with you throughout the procedure. He or she will help keep you comfortable and safe by continuing to give you medicines and adjusting the amount of medicine that you get. He or she will also watch your blood pressure, pulse, and oxygen levels to make sure that the anesthetics do not cause any problems.  If a breathing tube was used to help you breathe, it will be removed before you wake up. The procedure may vary among health care providers and hospitals. What happens after the procedure?  You will wake up, often slowly, after the procedure is complete, usually in a recovery area.  Your blood pressure, heart rate, breathing rate, and blood oxygen level will be monitored until the medicines you were given have worn off.  You may be given medicine to help you calm down if you feel anxious or agitated.  If you will be going home the same day, your health care provider may check to make sure you can stand, drink, and urinate.  Your health care  providers will treat your pain and side effects before you go home.  Do not drive for 24 hours if you received a sedative.  You may: ? Feel nauseous and vomit. ? Have a sore throat. ? Have mental slowness. ? Feel cold or shivery. ? Feel sleepy. ? Feel tired. ? Feel sore or achy, even in parts of your body where you did not have surgery. This information is not intended to replace advice given to you by your health care provider. Make sure you discuss any questions you have with your health care provider. Document Released: 11/27/2007 Document Revised: 01/31/2016 Document Reviewed: 08/04/2015 Elsevier Interactive Patient Education  2018 [place] Anesthesia, Adult, Care After These instructions provide you with information about caring for yourself after your procedure. Your health care provider may also give you more specific instructions. Your treatment has been planned according to current medical practices, but problems sometimes occur. Call your health care provider if you have any problems or questions after your procedure. What can I expect after the procedure? After the procedure, it is common to have:  Vomiting.  A sore throat.  Mental slowness.  It is common to feel:  Nauseous.  Cold or shivery.  Sleepy.  Tired.  Sore or achy, even in parts of your body where you did not have surgery.  Follow these instructions at home: For at least 24 hours after the procedure:  Do not: ? Participate in activities where you could fall or become injured. ? Drive. ? Use heavy machinery. ? Drink alcohol. ? Take sleeping pills or medicines that cause drowsiness. ? Make important decisions or sign legal documents. ? Take care of children on your own.  Rest. Eating and drinking  If you vomit, drink water, juice, or soup when you  can drink without vomiting.  Drink enough fluid to keep your urine clear or pale yellow.  Make sure you have little or no nausea  before eating solid foods.  Follow the diet recommended by your health care provider. General instructions  Have a responsible adult stay with you until you are awake and alert.  Return to your normal activities as told by your health care provider. Ask your health care provider what activities are safe for you.  Take over-the-counter and prescription medicines only as told by your health care provider.  If you smoke, do not smoke without supervision.  Keep all follow-up visits as told by your health care provider. This is important. Contact a health care provider if:  You continue to have nausea or vomiting at home, and medicines are not helpful.  You cannot drink fluids or start eating again.  You cannot urinate after 8-12 hours.  You develop a skin rash.  You have fever.  You have increasing redness at the site of your procedure. Get help right away if:  You have difficulty breathing.  You have chest pain.  You have unexpected bleeding.  You feel that you are having a life-threatening or urgent problem. This information is not intended to replace advice given to you by your health care provider. Make sure you discuss any questions you have with your health care provider. Document Released: 11/26/2000 Document Revised: 01/23/2016 Document Reviewed: 08/04/2015 Elsevier Interactive Patient Education  [employer].

## 2017-04-23 NOTE — H&P (Signed)
Nancy Novak; [id number]; Apr 29, 1991   HPI Patient is a 26 year old white female who presents with worsening right upper quadrant abdominal pain. She has been to the emergency room at [facility] twice. She states that the pain is made worse with eating food. The pain is in the right upper quadrant and radiates to the right flank. She does have nausea and vomiting. She denies any fever, chills, jaundice. She does have fatty food intolerance. Her pain is currently 4 out of 10. Past Medical History:  Diagnosis Date  . Asthma     Past Surgical History:  Procedure Laterality Date  . CYST EXCISION      Family History  Problem Relation Age of Onset  . Hypertension Mother   . Hypertension Paternal Grandmother   . Cancer Paternal Grandfather        lung    Current Outpatient Prescriptions on File Prior to Visit  Medication Sig Dispense Refill  . Multiple Vitamin (MULTIVITAMIN WITH MINERALS) TABS tablet Take 1 tablet by mouth daily.    . norgestimate-ethinyl estradiol (ORTHO-CYCLEN,SPRINTEC,PREVIFEM) 0.25-35 MG-MCG tablet Take 1 tablet by mouth daily.     No current facility-administered medications on file prior to visit.     Allergies  Allergen Reactions  . Bee Venom Swelling  . Other Hives    Bell peppers  . Codeine Hives, Nausea Only and Rash    History  Alcohol Use No    History  Smoking Status  . Former Smoker  Smokeless Tobacco  . Never Used    Review of Systems  Constitutional: Positive for malaise/fatigue.  HENT: Negative.   Eyes: Negative.   Respiratory: Negative.   Cardiovascular: Negative.   Gastrointestinal: Positive for abdominal pain, heartburn, nausea and vomiting.  Genitourinary: Negative.   Musculoskeletal: Positive for back pain, joint pain and neck pain.  Skin: Negative.   Neurological: Positive for headaches.  Endo/Heme/Allergies: Negative.   Psychiatric/Behavioral: Negative.     Objective   Vitals:   04/23/17 1202  BP:  123/79  Pulse: 85  Resp: 18  Temp: 98.3 F (36.8 C)    Physical Exam  Constitutional: She is oriented to person, place, and time and well-developed, well-nourished, and in no distress.  HENT:  Head: Normocephalic and atraumatic.  Eyes: No scleral icterus.  Cardiovascular: Normal rate, regular rhythm and normal heart sounds.  Exam reveals no gallop and no friction rub.   No murmur heard. Pulmonary/Chest: Effort normal and breath sounds normal. No respiratory distress. She has no wheezes. She has no rales.  Abdominal: Soft. Bowel sounds are normal. She exhibits no distension. There is tenderness. There is no rebound and no guarding.  Mild discomfort to palpation in the right upper quadrant. No rigidity noted.  Neurological: She is alert and oriented to person, place, and time.  Skin: Skin is warm and dry.  Vitals reviewed.  Ultrasound the gallbladder negative for cholelithiasis. HIDA scan reveals low gallbladder ejection fraction at 18%. Reproducible symptoms were noted. Assessment  Chronic cholecystitis Plan   Patient scheduled for laparoscopic cholecystectomy on 04/26/2017. The risks and benefits of the procedure including bleeding, infection, hepatobiliary injury, and the possibility of an open procedure were fully explained to the patient, who gave informed consent.

## 2017-04-23 NOTE — Patient Instructions (Signed)

## 2017-04-23 NOTE — Progress Notes (Signed)
Nancy Novak; [id number]; 07-Jun-1991   HPI Patient is a 26 year old white female who presents with worsening right upper quadrant abdominal pain. She has been to the emergency room at [facility] twice. She states that the pain is made worse with eating food. The pain is in the right upper quadrant and radiates to the right flank. She does have nausea and vomiting. She denies any fever, chills, jaundice. She does have fatty food intolerance. Her pain is currently 4 out of 10. Past Medical History:  Diagnosis Date  . Asthma     Past Surgical History:  Procedure Laterality Date  . CYST EXCISION      Family History  Problem Relation Age of Onset  . Hypertension Mother   . Hypertension Paternal Grandmother   . Cancer Paternal Grandfather        lung    Current Outpatient Prescriptions on File Prior to Visit  Medication Sig Dispense Refill  . Multiple Vitamin (MULTIVITAMIN WITH MINERALS) TABS tablet Take 1 tablet by mouth daily.    . norgestimate-ethinyl estradiol (ORTHO-CYCLEN,SPRINTEC,PREVIFEM) 0.25-35 MG-MCG tablet Take 1 tablet by mouth daily.     No current facility-administered medications on file prior to visit.     Allergies  Allergen Reactions  . Bee Venom Swelling  . Other Hives    Bell peppers  . Codeine Hives, Nausea Only and Rash    History  Alcohol Use No    History  Smoking Status  . Former Smoker  Smokeless Tobacco  . Never Used    Review of Systems  Constitutional: Positive for malaise/fatigue.  HENT: Negative.   Eyes: Negative.   Respiratory: Negative.   Cardiovascular: Negative.   Gastrointestinal: Positive for abdominal pain, heartburn, nausea and vomiting.  Genitourinary: Negative.   Musculoskeletal: Positive for back pain, joint pain and neck pain.  Skin: Negative.   Neurological: Positive for headaches.  Endo/Heme/Allergies: Negative.   Psychiatric/Behavioral: Negative.     Objective   Vitals:   04/23/17 1202  BP:  123/79  Pulse: 85  Resp: 18  Temp: 98.3 F (36.8 C)    Physical Exam  Constitutional: She is oriented to person, place, and time and well-developed, well-nourished, and in no distress.  HENT:  Head: Normocephalic and atraumatic.  Eyes: No scleral icterus.  Cardiovascular: Normal rate, regular rhythm and normal heart sounds.  Exam reveals no gallop and no friction rub.   No murmur heard. Pulmonary/Chest: Effort normal and breath sounds normal. No respiratory distress. She has no wheezes. She has no rales.  Abdominal: Soft. Bowel sounds are normal. She exhibits no distension. There is tenderness. There is no rebound and no guarding.  Mild discomfort to palpation in the right upper quadrant. No rigidity noted.  Neurological: She is alert and oriented to person, place, and time.  Skin: Skin is warm and dry.  Vitals reviewed.  Ultrasound the gallbladder negative for cholelithiasis. HIDA scan reveals low gallbladder ejection fraction at 18%. Reproducible symptoms were noted. Assessment  Chronic cholecystitis Plan   Patient scheduled for laparoscopic cholecystectomy on 04/26/2017. The risks and benefits of the procedure including bleeding, infection, hepatobiliary injury, and the possibility of an open procedure were fully explained to the patient, who gave informed consent.

## 2017-04-24 ENCOUNTER — Inpatient Hospital Stay (HOSPITAL_COMMUNITY): Admission: RE | Admit: 2017-04-24 | Payer: PRIVATE HEALTH INSURANCE | Source: Ambulatory Visit

## 2017-04-24 MED ORDER — PROMETHAZINE HCL 25 MG PO TABS: 25.0000 mg | ORAL_TABLET | Freq: Four times a day (QID) | ORAL | 1 refills | Status: DC | PRN

## 2017-04-24 NOTE — Addendum Note (Signed)
Addended by: Aviva Signs A on: 04/24/2017 04:46 PM   Modules accepted: Orders

## 2017-04-26 ENCOUNTER — Ambulatory Visit (HOSPITAL_COMMUNITY): Payer: PRIVATE HEALTH INSURANCE | Admitting: Anesthesiology

## 2017-04-26 ENCOUNTER — Encounter (HOSPITAL_COMMUNITY): Payer: Self-pay | Admitting: *Deleted

## 2017-04-26 ENCOUNTER — Ambulatory Visit (HOSPITAL_COMMUNITY)
Admission: RE | Admit: 2017-04-26 | Discharge: 2017-04-26 | Disposition: A | Discharge status: Home / Self Care | Payer: PRIVATE HEALTH INSURANCE | Source: Ambulatory Visit | Attending: General Surgery | Admitting: General Surgery

## 2017-04-26 ENCOUNTER — Encounter (HOSPITAL_COMMUNITY)
Admission: RE | Disposition: A | Discharge status: Home / Self Care | Payer: Self-pay | Source: Ambulatory Visit | Attending: General Surgery

## 2017-04-26 DIAGNOSIS — J45909 Unspecified asthma, uncomplicated: Secondary | ICD-10-CM | POA: Diagnosis not present

## 2017-04-26 DIAGNOSIS — Z87891 Personal history of nicotine dependence: Secondary | ICD-10-CM | POA: Diagnosis not present

## 2017-04-26 DIAGNOSIS — E669 Obesity, unspecified: Secondary | ICD-10-CM | POA: Diagnosis not present

## 2017-04-26 DIAGNOSIS — K811 Chronic cholecystitis: Secondary | ICD-10-CM

## 2017-04-26 DIAGNOSIS — Z91018 Allergy to other foods: Secondary | ICD-10-CM | POA: Insufficient documentation

## 2017-04-26 DIAGNOSIS — Z91030 Bee allergy status: Secondary | ICD-10-CM | POA: Insufficient documentation

## 2017-04-26 DIAGNOSIS — Z6835 Body mass index (BMI) 35.0-35.9, adult: Secondary | ICD-10-CM | POA: Diagnosis not present

## 2017-04-26 DIAGNOSIS — Z885 Allergy status to narcotic agent status: Secondary | ICD-10-CM | POA: Insufficient documentation

## 2017-04-26 HISTORY — PX: CHOLECYSTECTOMY: SHX55

## 2017-04-26 LAB — HCG, SERUM, QUALITATIVE: Preg, Serum: NEGATIVE

## 2017-04-26 SURGERY — LAPAROSCOPIC CHOLECYSTECTOMY
Anesthesia: General | Site: Abdomen

## 2017-04-26 MED ORDER — SODIUM CHLORIDE 0.9% FLUSH
INTRAVENOUS | Status: AC
  Filled 2017-04-26: qty 10

## 2017-04-26 MED ORDER — NEOSTIGMINE METHYLSULFATE 10 MG/10ML IV SOLN
INTRAVENOUS | Status: DC | PRN
  Administered 2017-04-26: 4 mg via INTRAVENOUS

## 2017-04-26 MED ORDER — LACTATED RINGERS IV SOLN
INTRAVENOUS | Status: DC
  Administered 2017-04-26: 09:00:00 via INTRAVENOUS

## 2017-04-26 MED ORDER — CHLORHEXIDINE GLUCONATE CLOTH 2 % EX PADS
6.0000 | MEDICATED_PAD | Freq: Once | CUTANEOUS | Status: DC
Start: 2017-04-26 — End: 2017-04-26

## 2017-04-26 MED ORDER — GLYCOPYRROLATE 0.2 MG/ML IJ SOLN
INTRAMUSCULAR | Status: DC | PRN
  Administered 2017-04-26: .7 mg via INTRAVENOUS

## 2017-04-26 MED ORDER — BUPIVACAINE HCL (PF) 0.5 % IJ SOLN
INTRAMUSCULAR | Status: DC | PRN
  Administered 2017-04-26: 10 mL

## 2017-04-26 MED ORDER — CIPROFLOXACIN IN D5W 400 MG/200ML IV SOLN
400.0000 mg | INTRAVENOUS | Status: AC
  Administered 2017-04-26: 400 mg via INTRAVENOUS
  Filled 2017-04-26: qty 200

## 2017-04-26 MED ORDER — GLYCOPYRROLATE 0.2 MG/ML IJ SOLN
INTRAMUSCULAR | Status: AC
  Filled 2017-04-26: qty 4

## 2017-04-26 MED ORDER — ONDANSETRON HCL 4 MG/2ML IJ SOLN
INTRAMUSCULAR | Status: DC | PRN
  Administered 2017-04-26: 4 mg via INTRAVENOUS

## 2017-04-26 MED ORDER — 0.9 % SODIUM CHLORIDE (POUR BTL) OPTIME
TOPICAL | Status: DC | PRN
  Administered 2017-04-26: 1000 mL

## 2017-04-26 MED ORDER — PROMETHAZINE HCL 25 MG/ML IJ SOLN
6.2500 mg | Freq: Once | INTRAMUSCULAR | Status: AC
  Administered 2017-04-26: 6.25 mg via INTRAVENOUS

## 2017-04-26 MED ORDER — NEOSTIGMINE METHYLSULFATE 10 MG/10ML IV SOLN
INTRAVENOUS | Status: AC
  Filled 2017-04-26: qty 1

## 2017-04-26 MED ORDER — PROPOFOL 10 MG/ML IV BOLUS
INTRAVENOUS | Status: AC
Start: 2017-04-26 — End: 2017-04-26
  Filled 2017-04-26: qty 40

## 2017-04-26 MED ORDER — MIDAZOLAM HCL 2 MG/2ML IJ SOLN
2.0000 mg | Freq: Once | INTRAMUSCULAR | Status: AC
  Administered 2017-04-26: 2 mg via INTRAVENOUS

## 2017-04-26 MED ORDER — GLYCOPYRROLATE 0.2 MG/ML IJ SOLN
INTRAMUSCULAR | Status: AC
  Filled 2017-04-26: qty 3

## 2017-04-26 MED ORDER — PROMETHAZINE HCL 25 MG/ML IJ SOLN
INTRAMUSCULAR | Status: AC
  Filled 2017-04-26: qty 1

## 2017-04-26 MED ORDER — HYDROMORPHONE HCL 1 MG/ML IJ SOLN
0.5000 mg | Freq: Once | INTRAMUSCULAR | Status: AC
  Administered 2017-04-26: 0.5 mg via INTRAVENOUS

## 2017-04-26 MED ORDER — LIDOCAINE HCL (CARDIAC) 10 MG/ML IV SOLN
INTRAVENOUS | Status: DC | PRN
  Administered 2017-04-26: 50 mg via INTRAVENOUS

## 2017-04-26 MED ORDER — ACETAMINOPHEN 325 MG PO TABS
650.0000 mg | ORAL_TABLET | Freq: Four times a day (QID) | ORAL | Status: DC | PRN
  Administered 2017-04-26: 650 mg via ORAL
  Filled 2017-04-26: qty 2

## 2017-04-26 MED ORDER — DEXAMETHASONE SODIUM PHOSPHATE 4 MG/ML IJ SOLN
INTRAMUSCULAR | Status: AC
  Filled 2017-04-26: qty 1

## 2017-04-26 MED ORDER — KETOROLAC TROMETHAMINE 30 MG/ML IJ SOLN
30.0000 mg | Freq: Once | INTRAMUSCULAR | Status: AC
  Administered 2017-04-26: 30 mg via INTRAVENOUS
  Filled 2017-04-26: qty 1

## 2017-04-26 MED ORDER — PROPOFOL 10 MG/ML IV BOLUS
INTRAVENOUS | Status: DC | PRN
  Administered 2017-04-26: 180 mg via INTRAVENOUS
  Administered 2017-04-26 (×2): 20 mg via INTRAVENOUS

## 2017-04-26 MED ORDER — DEXAMETHASONE SODIUM PHOSPHATE 4 MG/ML IJ SOLN
INTRAMUSCULAR | Status: DC | PRN
  Administered 2017-04-26: 8 mg via INTRAVENOUS

## 2017-04-26 MED ORDER — FENTANYL CITRATE (PF) 100 MCG/2ML IJ SOLN
INTRAMUSCULAR | Status: AC
  Filled 2017-04-26: qty 2

## 2017-04-26 MED ORDER — TRAMADOL HCL 50 MG PO TABS: 50.0000 mg | ORAL_TABLET | ORAL | 0 refills | Status: DC | PRN

## 2017-04-26 MED ORDER — ROCURONIUM BROMIDE 100 MG/10ML IV SOLN
INTRAVENOUS | Status: DC | PRN
  Administered 2017-04-26: 5 mg via INTRAVENOUS
  Administered 2017-04-26: 25 mg via INTRAVENOUS
  Administered 2017-04-26: 5 mg via INTRAVENOUS

## 2017-04-26 MED ORDER — MIDAZOLAM HCL 2 MG/2ML IJ SOLN
INTRAMUSCULAR | Status: AC
  Filled 2017-04-26: qty 2

## 2017-04-26 MED ORDER — SCOPOLAMINE 1 MG/3DAYS TD PT72
1.0000 | MEDICATED_PATCH | TRANSDERMAL | Status: DC
  Administered 2017-04-26: 1.5 mg via TRANSDERMAL

## 2017-04-26 MED ORDER — FENTANYL CITRATE (PF) 250 MCG/5ML IJ SOLN
INTRAMUSCULAR | Status: AC
  Filled 2017-04-26: qty 5

## 2017-04-26 MED ORDER — LIDOCAINE HCL (PF) 1 % IJ SOLN
INTRAMUSCULAR | Status: AC
  Filled 2017-04-26: qty 5

## 2017-04-26 MED ORDER — ONDANSETRON 4 MG PO TBDP
4.0000 mg | ORAL_TABLET | Freq: Once | ORAL | Status: AC
  Administered 2017-04-26: 4 mg via ORAL
  Filled 2017-04-26: qty 1

## 2017-04-26 MED ORDER — ONDANSETRON HCL 4 MG/2ML IJ SOLN
INTRAMUSCULAR | Status: AC
  Filled 2017-04-26: qty 2

## 2017-04-26 MED ORDER — SCOPOLAMINE 1 MG/3DAYS TD PT72
MEDICATED_PATCH | TRANSDERMAL | Status: AC
  Filled 2017-04-26: qty 1

## 2017-04-26 MED ORDER — HYDROMORPHONE HCL 1 MG/ML IJ SOLN
INTRAMUSCULAR | Status: AC
  Filled 2017-04-26: qty 1

## 2017-04-26 MED ORDER — HEMOSTATIC AGENTS (NO CHARGE) OPTIME
TOPICAL | Status: DC | PRN
  Administered 2017-04-26: 1 via TOPICAL

## 2017-04-26 MED ORDER — FENTANYL CITRATE (PF) 100 MCG/2ML IJ SOLN
INTRAMUSCULAR | Status: DC | PRN
  Administered 2017-04-26: 25 ug via INTRAVENOUS
  Administered 2017-04-26 (×4): 50 ug via INTRAVENOUS
  Administered 2017-04-26 (×3): 25 ug via INTRAVENOUS

## 2017-04-26 MED ORDER — ROCURONIUM BROMIDE 50 MG/5ML IV SOLN
INTRAVENOUS | Status: AC
  Filled 2017-04-26: qty 1

## 2017-04-26 MED ORDER — MIDAZOLAM HCL 2 MG/2ML IJ SOLN
1.0000 mg | Freq: Once | INTRAMUSCULAR | Status: AC | PRN
  Administered 2017-04-26: 2 mg via INTRAVENOUS
  Filled 2017-04-26: qty 2

## 2017-04-26 SURGICAL SUPPLY — 50 items
APPLIER CLIP LAPSCP 10X32 DD (CLIP) ×3 IMPLANT
BAG HAMPER (MISCELLANEOUS) ×3 IMPLANT
BAG RETRIEVAL 10 (BASKET) ×1
BAG RETRIEVAL 10MM (BASKET) ×1
CHLORAPREP W/TINT 26ML (MISCELLANEOUS) ×3 IMPLANT
CLOTH BEACON ORANGE TIMEOUT ST (SAFETY) ×3 IMPLANT
COVER LIGHT HANDLE STERIS (MISCELLANEOUS) ×6 IMPLANT
DECANTER SPIKE VIAL GLASS SM (MISCELLANEOUS) ×3 IMPLANT
DERMABOND ADVANCED (GAUZE/BANDAGES/DRESSINGS) ×2
DERMABOND ADVANCED .7 DNX12 (GAUZE/BANDAGES/DRESSINGS) ×1 IMPLANT
ELECT REM PT RETURN 9FT ADLT (ELECTROSURGICAL) ×3
ELECTRODE REM PT RTRN 9FT ADLT (ELECTROSURGICAL) ×1 IMPLANT
FILTER SMOKE EVAC LAPAROSHD (FILTER) ×3 IMPLANT
FORMALIN 10 PREFIL 120ML (MISCELLANEOUS) ×3 IMPLANT
GLOVE BIO SURGEON STRL SZ 6.5 (GLOVE) ×2 IMPLANT
GLOVE BIO SURGEON STRL SZ7 (GLOVE) ×3 IMPLANT
GLOVE BIO SURGEONS STRL SZ 6.5 (GLOVE) ×1
GLOVE BIOGEL PI IND STRL 6.5 (GLOVE) ×1 IMPLANT
GLOVE BIOGEL PI IND STRL 7.0 (GLOVE) ×2 IMPLANT
GLOVE BIOGEL PI INDICATOR 6.5 (GLOVE) ×2
GLOVE BIOGEL PI INDICATOR 7.0 (GLOVE) ×4
GLOVE SURG SS PI 7.5 STRL IVOR (GLOVE) ×3 IMPLANT
GOWN STRL REUS W/ TWL XL LVL3 (GOWN DISPOSABLE) ×1 IMPLANT
GOWN STRL REUS W/TWL LRG LVL3 (GOWN DISPOSABLE) ×6 IMPLANT
GOWN STRL REUS W/TWL XL LVL3 (GOWN DISPOSABLE) ×2
HEMOSTAT SNOW SURGICEL 2X4 (HEMOSTASIS) ×3 IMPLANT
INST SET LAPROSCOPIC AP (KITS) ×3 IMPLANT
KIT ROOM TURNOVER APOR (KITS) ×3 IMPLANT
MANIFOLD NEPTUNE II (INSTRUMENTS) ×3 IMPLANT
NEEDLE INSUFFLATION 14GA 120MM (NEEDLE) ×3 IMPLANT
NS IRRIG 1000ML POUR BTL (IV SOLUTION) ×3 IMPLANT
PACK LAP CHOLE LZT030E (CUSTOM PROCEDURE TRAY) ×3 IMPLANT
PAD ARMBOARD 7.5X6 YLW CONV (MISCELLANEOUS) ×3 IMPLANT
SET BASIN LINEN APH (SET/KITS/TRAYS/PACK) ×3 IMPLANT
SLEEVE ENDOPATH XCEL 5M (ENDOMECHANICALS) ×3 IMPLANT
SPONGE GAUZE 2X2 8PLY STER LF (GAUZE/BANDAGES/DRESSINGS) ×1
SPONGE GAUZE 2X2 8PLY STRL LF (GAUZE/BANDAGES/DRESSINGS) ×2 IMPLANT
STAPLER VISISTAT (STAPLE) ×3 IMPLANT
SUT VIC AB 4-0 PS2 27 (SUTURE) ×6 IMPLANT
SUT VICRYL 0 UR6 27IN ABS (SUTURE) ×3 IMPLANT
SYS BAG RETRIEVAL 10MM (BASKET) ×1
SYSTEM BAG RETRIEVAL 10MM (BASKET) ×1 IMPLANT
TAPE CLOTH SURG 4X10 WHT LF (GAUZE/BANDAGES/DRESSINGS) ×3 IMPLANT
TROCAR ENDO BLADELESS 11MM (ENDOMECHANICALS) ×3 IMPLANT
TROCAR XCEL NON-BLD 5MMX100MML (ENDOMECHANICALS) ×3 IMPLANT
TROCAR XCEL UNIV SLVE 11M 100M (ENDOMECHANICALS) ×3 IMPLANT
TUBE CONNECTING 12'X1/4 (SUCTIONS) ×1
TUBE CONNECTING 12X1/4 (SUCTIONS) ×2 IMPLANT
TUBING INSUFFLATION (TUBING) ×3 IMPLANT
WARMER LAPAROSCOPE (MISCELLANEOUS) ×3 IMPLANT

## 2017-04-26 NOTE — Anesthesia Postprocedure Evaluation (Signed)
Anesthesia Post Note  Patient: Nancy Novak  Procedure(s) Performed: Procedure(s) (LRB): LAPAROSCOPIC CHOLECYSTECTOMY (N/A)  Patient location during evaluation: PACU Anesthesia Type: General Level of consciousness: awake and alert Pain management: satisfactory to patient Vital Signs Assessment: post-procedure vital signs reviewed and stable Respiratory status: spontaneous breathing Cardiovascular status: stable : Nausea resolving.     Last Vitals:  Vitals:   04/26/17 1145 04/26/17 1200  BP: 121/73 123/82  Pulse: 87 80  Resp: 15 13  Temp:    SpO2: 100% 95%    Last Pain:  Vitals:   04/26/17 1120  TempSrc:   PainSc: Asleep                 Malloree Raboin

## 2017-04-26 NOTE — Op Note (Signed)
Patient:  Nancy Novak  DOB:  1990-11-07  MRN:  [id number]   Preop Diagnosis:  Chronic cholecystitis  Postop Diagnosis:  Same  Procedure:  Laparoscopic cholecystectomy  Surgeon:  Aviva Signs, M.D.  Assistant: Dr. Blake Divine  Anes:  Gen. endotracheal  Indications:  Patient is a 26 year old white female who presents with biliary colic secondary to chronic cholecystitis. The risks and benefits of the procedure including bleeding, infection, hepatobiliary injury, and the possibility of an open procedure were fully explained to the patient, who gave informed consent.  Procedure note:  The patient was placed in the supine position. After induction of general endotracheal anesthesia, the abdomen was prepped and draped using the usual sterile technique with DuraPrep. Surgical site confirmation was performed.  A supraumbilical incision was made down to the fascia. A Veress needle was introduced into the abdominal cavity and confirmation of placement was done using the saline drop test. The abdomen was then insufflated to 16 mmHg pressure. An 11 mm trocar was reduced into the abdominal cavity under direct visualization without difficulty. The patient was placed in reverse Trendelenburg position and an additional 11 mm trocar was placed the epigastric region and 5 mm trochars were placed the right upper quadrant and right flank regions. The pelvis was inspected in a limited fashion in the was no evidence of endometriosis on the surface of the bowel or the abdominal wall. The liver was inspected and noted to be within normal limits. The gallbladder was retracted in a dynamic fashion in order to 5 a critical view of the triangle of Calot. The cystic duct was first identified. Its juncture to the infundibulum was fully identified. The cystic artery was noted to be alongside the cystic duct. Both were ligated and divided with endoclips. The gallbladder was freed away from the gallbladder fossa using  Bovie electrocautery. The gallbladder was delivered through the epigastric trocar site using an Endo Catch bag. The gallbladder fossa was inspected and no abnormal bleeding or bile leakage was noted. Surgicel was placed the gallbladder fossa. All fluid and air were then evacuated from the abdominal cavity prior to the removal of the trochars.  All wounds were irrigated with normal saline. All wounds were injected with 0.5% Sensorcaine. All incisions were closed using 4-0 Vicryl subcuticular sutures. Dermabond was applied.  All tape and needle counts were correct at the end of the procedure. The patient was extubated in the operating room and transferred to PACU in stable condition.   Complications:  None  EBL:  Minimal  Specimen:  Gallbladder

## 2017-04-26 NOTE — Anesthesia Procedure Notes (Signed)
Procedure Name: Intubation Date/Time: 04/26/2017 10:27 AM Performed by: Vista Deck Pre-anesthesia Checklist: Patient identified, Patient being monitored, Timeout performed, Emergency Drugs available and Suction available Patient Re-evaluated:Patient Re-evaluated prior to induction Oxygen Delivery Method: Circle System Utilized Preoxygenation: Pre-oxygenation with 100% oxygen Induction Type: IV induction and Cricoid Pressure applied Ventilation: Mask ventilation without difficulty Laryngoscope Size: Miller, 2, Mac and 3 Grade View: Grade I Tube type: Oral Tube size: 6.0 mm Number of attempts: 1 Airway Equipment and Method: Stylet Placement Confirmation: ETT inserted through vocal cords under direct vision,  positive ETCO2 and breath sounds checked- equal and bilateral Secured at: 21 cm Tube secured with: Tape Dental Injury: Teeth and Oropharynx as per pre-operative assessment and Bloody posterior oropharynx  Comments: DL with Mil 2. Grade 3 view. Ventilated. DL with Mac 3. Grade 1 view. Unable to pass 7 Ett. 6 ETT easily passed. Blood noted right posterior pharynx.

## 2017-04-26 NOTE — Discharge Instructions (Signed)
Laparoscopic Cholecystectomy, Care After °This sheet gives you information about how to care for yourself after your procedure. Your health care provider may also give you more specific instructions. If you have problems or questions, contact your health care provider. °What can I expect after the procedure? °After the procedure, it is common to have: °· Pain at your incision sites. You will be given medicines to control this pain. °· Mild nausea or vomiting. °· Bloating and possible shoulder pain from the air-like gas that was used during the procedure. °Follow these instructions at home: °Incision care  ° °· Follow instructions from your health care provider about how to take care of your incisions. Make sure you: °¨ Wash your hands with soap and water before you change your bandage (dressing). If soap and water are not available, use hand sanitizer. °¨ Change your dressing as told by your health care provider. °¨ Leave stitches (sutures), skin glue, or adhesive strips in place. These skin closures may need to be in place for 2 weeks or longer. If adhesive strip edges start to loosen and curl up, you may trim the loose edges. Do not remove adhesive strips completely unless your health care provider tells you to do that. °· Do not take baths, swim, or use a hot tub until your health care provider approves. Ask your health care provider if you can take showers. You may only be allowed to take sponge baths for bathing. °· Check your incision area every day for signs of infection. Check for: °¨ More redness, swelling, or pain. °¨ More fluid or blood. °¨ Warmth. °¨ Pus or a bad smell. °Activity  °· Do not drive or use heavy machinery while taking prescription pain medicine. °· Do not lift anything that is heavier than 10 lb (4.5 kg) until your health care provider approves. °· Do not play contact sports until your health care provider approves. °· Do not drive for 24 hours if you were given a medicine to help you relax  (sedative). °· Rest as needed. Do not return to work or school until your health care provider approves. °General instructions  °· Take over-the-counter and prescription medicines only as told by your health care provider. °· To prevent or treat constipation while you are taking prescription pain medicine, your health care provider may recommend that you: °¨ Drink enough fluid to keep your urine clear or pale yellow. °¨ Take over-the-counter or prescription medicines. °¨ Eat foods that are high in fiber, such as fresh fruits and vegetables, whole grains, and beans. °¨ Limit foods that are high in fat and processed sugars, such as fried and sweet foods. °Contact a health care provider if: °· You develop a rash. °· You have more redness, swelling, or pain around your incisions. °· You have more fluid or blood coming from your incisions. °· Your incisions feel warm to the touch. °· You have pus or a bad smell coming from your incisions. °· You have a fever. °· One or more of your incisions breaks open. °Get help right away if: °· You have trouble breathing. °· You have chest pain. °· You have increasing pain in your shoulders. °· You faint or feel dizzy when you stand. °· You have severe pain in your abdomen. °· You have nausea or vomiting that lasts for more than one day. °· You have leg pain. °This information is not intended to replace advice given to you by your health care provider. Make sure you discuss any   questions you have with your health care provider. °Document Released: 08/20/2005 Document Revised: 03/10/2016 Document Reviewed: 02/06/2016 °Elsevier Interactive Patient Education © 2017 [employer] ° °

## 2017-04-26 NOTE — Progress Notes (Signed)
Patient had nausea after procedure. Phenergan 6.25mg  and  8mg  decadron was given. Dr. Delena Bali ordered a scopolamine patch for patient to take home.

## 2017-04-26 NOTE — Transfer of Care (Signed)
Immediate Anesthesia Transfer of Care Note  Patient: Nancy Novak  Procedure(s) Performed: Procedure(s): LAPAROSCOPIC CHOLECYSTECTOMY (N/A)  Patient Location: PACU  Anesthesia Type:General  Level of Consciousness: awake  Airway & Oxygen Therapy: Patient Spontanous Breathing and Patient connected to face mask oxygen  Post-op Assessment: Report given to RN  Post vital signs: Reviewed and stable  Last Vitals:  Vitals:   04/26/17 1005 04/26/17 1010  BP: 122/80   Resp: (!) 21 (!) 23  Temp:    SpO2: 100% 100%    Last Pain:  Vitals:   04/26/17 0901  TempSrc: Oral  PainSc: 3       Patients Stated Pain Goal: 8 (09/64/38 3818)  Complications: No apparent anesthesia complications

## 2017-04-26 NOTE — Interval H&P Note (Signed)
History and Physical Interval Note:  04/26/2017 9:55 AM  Nancy Novak  has presented today for surgery, with the diagnosis of chronic cholecystitis  The various methods of treatment have been discussed with the patient and family. After consideration of risks, benefits and other options for treatment, the patient has consented to  Procedure(s): LAPAROSCOPIC CHOLECYSTECTOMY (N/A) as a surgical intervention .  The patient's history has been reviewed, patient examined, no change in status, stable for surgery.  I have reviewed the patient's chart and labs.  Questions were answered to the patient's satisfaction.     Aviva Signs

## 2017-04-26 NOTE — Anesthesia Preprocedure Evaluation (Addendum)
Anesthesia Evaluation  Patient identified by MRN, date of birth, ID band Patient awake    Airway Mallampati: I  TM Distance: >3 FB Neck ROM: Full   Comment: Thick neck Dental  (+) Teeth Intact   Pulmonary neg pulmonary ROS, asthma (as a child) , former smoker,    Pulmonary exam normal breath sounds clear to auscultation       Cardiovascular Exercise Tolerance: Good negative cardio ROS   Rhythm:Regular Rate:Normal     Neuro/Psych negative neurological ROS  negative psych ROS   GI/Hepatic negative GI ROS, Neg liver ROS,   Endo/Other  negative endocrine ROS  Renal/GU negative Renal ROS  negative genitourinary   Musculoskeletal   Abdominal Normal abdominal exam  (+) + obese,   Peds negative pediatric ROS (+)  Hematology negative hematology ROS (+)   Anesthesia Other Findings   Reproductive/Obstetrics                           Anesthesia Physical Anesthesia Plan  ASA: II  Anesthesia Plan: General   Post-op Pain Management:    Induction: Intravenous  PONV Risk Score and Plan: Ondansetron and Dexamethasone  Airway Management Planned: Oral ETT  Additional Equipment:   Intra-op Plan:   Post-operative Plan: Extubation in OR  Informed Consent: I have reviewed the patients History and Physical, chart, labs and discussed the procedure including the risks, benefits and alternatives for the proposed anesthesia with the patient or authorized representative who has indicated his/her understanding and acceptance.     Plan Discussed with: CRNA  Anesthesia Plan Comments:         Anesthesia Quick Evaluation

## 2017-04-29 ENCOUNTER — Encounter (HOSPITAL_COMMUNITY): Payer: Self-pay | Admitting: General Surgery

## 2017-04-30 ENCOUNTER — Encounter: Payer: Self-pay | Admitting: General Surgery

## 2017-04-30 ENCOUNTER — Ambulatory Visit (INDEPENDENT_AMBULATORY_CARE_PROVIDER_SITE_OTHER): Payer: Self-pay | Admitting: General Surgery

## 2017-04-30 VITALS — BP 143/72 | HR 96 | Temp 98.2°F | Resp 18 | Ht 65.0 in | Wt 209.0 lb

## 2017-04-30 DIAGNOSIS — Z09 Encounter for follow-up examination after completed treatment for conditions other than malignant neoplasm: Secondary | ICD-10-CM

## 2017-04-30 NOTE — Progress Notes (Signed)
Subjective:     Nancy Novak  Status post laparoscopic cholecystectomy. Doing well. Still has decreased appetite. Preoperative nausea and emesis improved. Objective:    BP (!) 143/72   Pulse 96   Temp 98.2 F (36.8 C)   Resp 18   Ht 5\' 5"  (1.651 m)   Wt 209 lb (94.8 kg)   LMP 04/01/2017   BMI 34.78 kg/m   Abdomen soft, incisions healing well. Resolving ecchymosis present.    Final pathology consistent with diagnosis  Assessment:    Doing well postoperatively.    Plan:   May return to work without restrictions on 05/02/2017.

## 2017-05-02 ENCOUNTER — Ambulatory Visit: Payer: PRIVATE HEALTH INSURANCE | Admitting: General Surgery

## 2017-06-19 ENCOUNTER — Encounter (HOSPITAL_COMMUNITY): Payer: Self-pay | Admitting: Emergency Medicine

## 2017-06-19 ENCOUNTER — Emergency Department (HOSPITAL_COMMUNITY)
Admission: EM | Admit: 2017-06-19 | Discharge: 2017-06-19 | Disposition: A | Discharge status: Home / Self Care | Payer: PRIVATE HEALTH INSURANCE | Attending: Emergency Medicine | Admitting: Emergency Medicine

## 2017-06-19 DIAGNOSIS — J45909 Unspecified asthma, uncomplicated: Secondary | ICD-10-CM | POA: Diagnosis not present

## 2017-06-19 DIAGNOSIS — T592X1A Toxic effect of formaldehyde, accidental (unintentional), initial encounter: Secondary | ICD-10-CM | POA: Diagnosis present

## 2017-06-19 DIAGNOSIS — Z79899 Other long term (current) drug therapy: Secondary | ICD-10-CM | POA: Diagnosis not present

## 2017-06-19 DIAGNOSIS — H5710 Ocular pain, unspecified eye: Secondary | ICD-10-CM | POA: Diagnosis not present

## 2017-06-19 DIAGNOSIS — Z77098 Contact with and (suspected) exposure to other hazardous, chiefly nonmedicinal, chemicals: Secondary | ICD-10-CM

## 2017-06-19 DIAGNOSIS — R42 Dizziness and giddiness: Secondary | ICD-10-CM | POA: Diagnosis not present

## 2017-06-19 DIAGNOSIS — Z87891 Personal history of nicotine dependence: Secondary | ICD-10-CM | POA: Diagnosis not present

## 2017-06-19 NOTE — ED Triage Notes (Signed)
Pt was in adjacent room to 432ml formaldehyde spill. Initial c/o burning/irritation to eyes. No complaints since decon shower. nad noted.

## 2017-06-19 NOTE — ED Provider Notes (Signed)
[facility] EMERGENCY DEPARTMENT Provider Note   CSN: [id number] Arrival date & time: 06/19/17  1034     History   Chief Complaint No chief complaint on file.   HPI Nancy Novak is a 26 y.o. female.  HPI   26 year old female presenting for evaluation of exposure to formaldehyde fume.  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  During the surgery, a large formaldehyde bottle with specimen fell to the ground and approximately 458ml of liquid were exposed to the air.  It was approximately 30 minutes of inhalation chemical exposure before the OR patient was safely extubated and the staff can leave the room.  She initially complaining of dizziness, burning to  Eyes. After taking decon shower she report improvement of sxs.  No hx of lung disease.    Past Medical History:  Diagnosis Date  . Asthma     Patient Active Problem List   Diagnosis Date Noted  . Chronic cholecystitis     Past Surgical History:  Procedure Laterality Date  . CHOLECYSTECTOMY N/A 04/26/2017   Procedure: LAPAROSCOPIC CHOLECYSTECTOMY;  Surgeon: Aviva Signs, MD;  Location: [facility];  Service: General;  Laterality: N/A;  . CYST EXCISION      OB History    Gravida Para Term Preterm AB Living   2 0 0 0 1 0   SAB TAB Ectopic Multiple Live Births   1 0 0 0        Obstetric Comments   Had an early pregnancy loss last summer       Home Medications    Prior to Admission medications   Medication Sig Start Date End Date Taking? Authorizing Provider  Multiple Vitamin (MULTIVITAMIN WITH MINERALS) TABS tablet Take 1 tablet by mouth daily.    [provider]  norgestimate-ethinyl estradiol (ORTHO-CYCLEN,SPRINTEC,PREVIFEM) 0.25-35 MG-MCG tablet Take 1 tablet by mouth daily.    [provider]  promethazine (PHENERGAN) 25 MG tablet Take 1 tablet (25 mg total) by mouth every 6 (six) hours as needed for nausea. 04/24/17   Aviva Signs, MD  traMADol  (ULTRAM) 50 MG tablet Take 1 tablet (50 mg total) by mouth every 4 (four) hours as needed for moderate pain or severe pain. 04/26/17   Aviva Signs, MD    Family History Family History  Problem Relation Age of Onset  . Hypertension Mother   . Hypertension Paternal Grandmother   . Cancer Paternal Grandfather        lung    Social History Social History  Substance Use Topics  . Smoking status: Former Research scientist (life sciences)  . Smokeless tobacco: Never Used  . Alcohol use No     Allergies   Bee venom; Other; and Codeine   Review of Systems Review of Systems  All other systems reviewed and are negative.    Physical Exam Updated Vital Signs There were no vitals taken for this visit.  Physical Exam  Constitutional: She is oriented to person, place, and time. She appears well-developed and well-nourished. No distress.  HENT:  Head: Atraumatic.  Nose: Nose normal.  Mouth/Throat: Oropharynx is clear and moist.  Eyes: Conjunctivae are normal.  Neck: Neck supple.  Cardiovascular: Normal rate and regular rhythm.   Pulmonary/Chest: Effort normal and breath sounds normal. No respiratory distress. She has no wheezes.  Abdominal: Soft.  Neurological: She is alert and oriented to person, place, and time.  Skin: No rash noted.  Psychiatric: She  has a normal mood and affect.  Nursing note and vitals reviewed.    ED Treatments / Results  Labs (all labs ordered are listed, but only abnormal results are displayed) Labs Reviewed - No data to display  EKG  EKG Interpretation None       Radiology No results found.  Procedures Procedures (including critical care time)  Medications Ordered in ED Medications - No data to display   Initial Impression / Assessment and Plan / ED Course  I have reviewed the triage vital signs and the nursing notes.  Pertinent labs & imaging results that were available during my care of the patient were reviewed by me and considered in my medical decision  making (see chart for details).     BP 123/70 (BP Location: Left Arm)   Pulse 93   Temp 98.1 F (36.7 C) (Oral)   Resp 18   LMP 06/05/2017   SpO2 99%    Final Clinical Impressions(s) / ED Diagnoses   Final diagnoses:  Exposure to chemical inhalation    New Prescriptions New Prescriptions   No medications on file   11:46 AM Pt here for inhalation exposure to formaldehyde while working in the [employer] today.  Approximately 30 minutes of exposure time. Supportive measures including removing of contaminated clothing, and the skin is thoroughly washed.  An [place] have been set up to care for the staff with exposures.    Pt is currently sxs free.  She has been monitored for the past 1-2 hrs.  Felt stable for discharge.  Return precaution given.    Domenic Moras, PA-C 06/19/17 1301    Pattricia Boss, MD 06/19/17 [id number]

## 2017-07-10 ENCOUNTER — Inpatient Hospital Stay (HOSPITAL_COMMUNITY)
Admission: AD | Admit: 2017-07-10 | Discharge: 2017-07-10 | Disposition: A | Discharge status: Home / Self Care | Payer: PRIVATE HEALTH INSURANCE | Source: Ambulatory Visit | Attending: Obstetrics & Gynecology | Admitting: Obstetrics & Gynecology

## 2017-07-10 ENCOUNTER — Other Ambulatory Visit: Payer: Self-pay

## 2017-07-10 DIAGNOSIS — R42 Dizziness and giddiness: Secondary | ICD-10-CM | POA: Diagnosis not present

## 2017-07-10 DIAGNOSIS — R1031 Right lower quadrant pain: Secondary | ICD-10-CM | POA: Diagnosis not present

## 2017-07-10 DIAGNOSIS — Z3202 Encounter for pregnancy test, result negative: Secondary | ICD-10-CM | POA: Insufficient documentation

## 2017-07-10 DIAGNOSIS — Z885 Allergy status to narcotic agent status: Secondary | ICD-10-CM | POA: Diagnosis not present

## 2017-07-10 DIAGNOSIS — Z87891 Personal history of nicotine dependence: Secondary | ICD-10-CM | POA: Insufficient documentation

## 2017-07-10 DIAGNOSIS — R110 Nausea: Secondary | ICD-10-CM | POA: Diagnosis not present

## 2017-07-10 DIAGNOSIS — R55 Syncope and collapse: Secondary | ICD-10-CM | POA: Diagnosis not present

## 2017-07-10 DIAGNOSIS — N898 Other specified noninflammatory disorders of vagina: Secondary | ICD-10-CM | POA: Insufficient documentation

## 2017-07-10 LAB — URINALYSIS, ROUTINE W REFLEX MICROSCOPIC
BILIRUBIN URINE: NEGATIVE
Bacteria, UA: NONE SEEN
GLUCOSE, UA: NEGATIVE mg/dL
HGB URINE DIPSTICK: NEGATIVE
KETONES UR: NEGATIVE mg/dL
NITRITE: NEGATIVE
PROTEIN: 30 mg/dL — AB
Specific Gravity, Urine: 1.031 — ABNORMAL HIGH (ref 1.005–1.030)
pH: 6 (ref 5.0–8.0)

## 2017-07-10 LAB — CBC
HCT: 41.8 % (ref 36.0–46.0)
Hemoglobin: 13.6 g/dL (ref 12.0–15.0)
MCH: 28.2 pg (ref 26.0–34.0)
MCHC: 32.5 g/dL (ref 30.0–36.0)
MCV: 86.7 fL (ref 78.0–100.0)
PLATELETS: 317 10*3/uL (ref 150–400)
RBC: 4.82 MIL/uL (ref 3.87–5.11)
RDW: 14.4 % (ref 11.5–15.5)
WBC: 9.4 10*3/uL (ref 4.0–10.5)

## 2017-07-10 LAB — COMPREHENSIVE METABOLIC PANEL
ALK PHOS: 101 U/L (ref 38–126)
ALT: 17 U/L (ref 14–54)
ANION GAP: 7 (ref 5–15)
AST: 17 U/L (ref 15–41)
Albumin: 3.6 g/dL (ref 3.5–5.0)
BUN: 11 mg/dL (ref 6–20)
CALCIUM: 8.7 mg/dL — AB (ref 8.9–10.3)
CHLORIDE: 103 mmol/L (ref 101–111)
CO2: 27 mmol/L (ref 22–32)
Creatinine, Ser: 0.67 mg/dL (ref 0.44–1.00)
GFR calc non Af Amer: >60 mL/min (ref >60)
Glucose, Bld: 104 mg/dL — ABNORMAL HIGH (ref 65–99)
Potassium: 4 mmol/L (ref 3.5–5.1)
SODIUM: 137 mmol/L (ref 135–145)
Total Bilirubin: 0.5 mg/dL (ref 0.3–1.2)
Total Protein: 7.4 g/dL (ref 6.5–8.1)

## 2017-07-10 LAB — WET PREP, GENITAL
CLUE CELLS WET PREP: NONE SEEN
Sperm: NONE SEEN
TRICH WET PREP: NONE SEEN
Yeast Wet Prep HPF POC: NONE SEEN

## 2017-07-10 LAB — POCT PREGNANCY, URINE: Preg Test, Ur: NEGATIVE

## 2017-07-10 NOTE — MAU Provider Note (Signed)
Chief Complaint: Abdominal Pain ("all over body aches"); Nausea; and Vaginal Discharge   First Provider Initiated Contact with Patient 07/10/17 1250      SUBJECTIVE HPI: Nancy Novak is a 26 y.o. 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  These symptoms resolved spontaneously. After these symptoms started, she went to the bathroom and had diarrhea and saw white vaginal discharge which is unusual for her. She reports pain in her right lower quadrant that is intermittent, starting right after her symptoms in the OR. She denies fever or vomiting. The last time she had symptoms like the dizziness and nausea in the OR, she was pregnant so she came to MAU for evaluation.  She is taking OCPs for birth control and LMP was 1.5 weeks ago.  She ate breakfast and reports she has had enough fluids to drink today.  She has not tried any other treatments. There are no other associated symptoms. She denies vaginal bleeding, vaginal itching/burning, urinary symptoms, or h/a.   HPI  Past Medical History:  Diagnosis Date  . Asthma    Past Surgical History:  Procedure Laterality Date  . CYST EXCISION     Social History   Socioeconomic History  . Marital status: Married    Spouse name: Not on file  . Number of children: Not on file  . Years of education: Not on file  . Highest education level: Not on file  Social Needs  . Financial resource strain: Not on file  . Food insecurity - worry: Not on file  . Food insecurity - inability: Not on file  . Transportation needs - medical: Not on file  . Transportation needs - non-medical: Not on file  Occupational History  . Not on file  Tobacco Use  . Smoking status: Former Research scientist (life sciences)  . Smokeless tobacco: Never Used  Substance and Sexual Activity  . Alcohol use: No  . Drug use: No  . Sexual activity: Yes    Birth control/protection: None   Other Topics Concern  . Not on file  Social History Narrative  . Not on file   No current facility-administered medications on file prior to encounter.    Current Outpatient Medications on File Prior to Encounter  Medication Sig Dispense Refill  . Multiple Vitamin (MULTIVITAMIN WITH MINERALS) TABS tablet Take 1 tablet by mouth daily.    . norgestimate-ethinyl estradiol (ORTHO-CYCLEN,SPRINTEC,PREVIFEM) 0.25-35 MG-MCG tablet Take 1 tablet by mouth daily.     Allergies  Allergen Reactions  . Bee Venom Swelling  . Other Hives    Bell peppers  . Codeine Hives, Nausea Only and Rash    ROS:  Review of Systems  Constitutional: Positive for chills. Negative for fatigue and fever.  Respiratory: Negative for shortness of breath.   Cardiovascular: Negative for chest pain.  Gastrointestinal: Positive for abdominal pain, diarrhea and nausea. Negative for vomiting.  Genitourinary: Positive for vaginal discharge. Negative for difficulty urinating, dysuria, flank pain, pelvic pain, vaginal bleeding and vaginal pain.  Neurological: Positive for dizziness. Negative for headaches.  Psychiatric/Behavioral: Negative.      I have reviewed patient's Past Medical Hx, Surgical Hx, Family Hx, Social Hx, medications and allergies.   Physical Exam   Patient Vitals for the past 24 hrs:  BP Temp Temp src Pulse Resp Height Weight  07/10/17 1425 122/73 - - 87 - - -  07/10/17 1158 115/75 98.2 F (36.8  C) Oral 90 18 5\' 5"  (1.651 m) 209 lb (94.8 kg)   Constitutional: Well-developed, well-nourished female in no acute distress.  Cardiovascular: normal rate Respiratory: normal effort GI: Abd soft, non-tender.No rebound tenderness or guarding. Pos BS x 4 MS: Extremities nontender, no edema, normal ROM Neurologic: Alert and oriented x 4.  GU: Neg CVAT.  PELVIC EXAM: Cervix pink, visually closed, without lesion, scant white thin discharge, vaginal walls and external genitalia normal Bimanual exam: Cervix  0/long/high, firm, anterior, neg CMT, uterus nontender, nonenlarged, adnexa without tenderness, enlargement, or mass   LAB RESULTS Results for orders placed or performed during the hospital encounter of 07/10/17 (from the past 24 hour(s))  Urinalysis, Routine w reflex microscopic     Status: Abnormal   Collection Time: 07/10/17 11:48 AM  Result Value Ref Range   Color, Urine YELLOW YELLOW   APPearance CLEAR CLEAR   Specific Gravity, Urine 1.031 (H) 1.005 - 1.030   pH 6.0 5.0 - 8.0   Glucose, UA NEGATIVE NEGATIVE mg/dL   Hgb urine dipstick NEGATIVE NEGATIVE   Bilirubin Urine NEGATIVE NEGATIVE   Ketones, ur NEGATIVE NEGATIVE mg/dL   Protein, ur 30 (A) NEGATIVE mg/dL   Nitrite NEGATIVE NEGATIVE   Leukocytes, UA TRACE (A) NEGATIVE   RBC / HPF 0-5 0 - 5 RBC/hpf   WBC, UA 0-5 0 - 5 WBC/hpf   Bacteria, UA NONE SEEN NONE SEEN   Squamous Epithelial / LPF 0-5 (A) NONE SEEN   Mucus PRESENT   Pregnancy, urine POC     Status: None   Collection Time: 07/10/17 12:04 PM  Result Value Ref Range   Preg Test, Ur NEGATIVE NEGATIVE  CBC     Status: None   Collection Time: 07/10/17 12:45 PM  Result Value Ref Range   WBC 9.4 4.0 - 10.5 K/uL   RBC 4.82 3.87 - 5.11 MIL/uL   Hemoglobin 13.6 12.0 - 15.0 g/dL   HCT 41.8 36.0 - 46.0 %   MCV 86.7 78.0 - 100.0 fL   MCH 28.2 26.0 - 34.0 pg   MCHC 32.5 30.0 - 36.0 g/dL   RDW 14.4 11.5 - 15.5 %   Platelets 317 150 - 400 K/uL  Comprehensive metabolic panel     Status: Abnormal   Collection Time: 07/10/17 12:45 PM  Result Value Ref Range   Sodium 137 135 - 145 mmol/L   Potassium 4.0 3.5 - 5.1 mmol/L   Chloride 103 101 - 111 mmol/L   CO2 27 22 - 32 mmol/L   Glucose, Bld 104 (H) 65 - 99 mg/dL   BUN 11 6 - 20 mg/dL   Creatinine, Ser 0.67 0.44 - 1.00 mg/dL   Calcium 8.7 (L) 8.9 - 10.3 mg/dL   Total Protein 7.4 6.5 - 8.1 g/dL   Albumin 3.6 3.5 - 5.0 g/dL   AST 17 15 - 41 U/L   ALT 17 14 - 54 U/L   Alkaline Phosphatase 101 38 - 126 U/L   Total  Bilirubin 0.5 0.3 - 1.2 mg/dL   GFR calc non Af Amer >60 >60 mL/min   GFR calc Af Amer >60 >60 mL/min   Anion gap 7 5 - 15  Wet prep, genital     Status: Abnormal   Collection Time: 07/10/17  1:44 PM  Result Value Ref Range   Yeast Wet Prep HPF POC NONE SEEN NONE SEEN   Trich, Wet Prep NONE SEEN NONE SEEN   Clue Cells Wet Prep HPF POC NONE SEEN  NONE SEEN   WBC, Wet Prep HPF POC MANY (A) NONE SEEN   Sperm NONE SEEN        IMAGING No results found.  MAU Management/MDM: Ordered labs and reviewed results.  No evidence of acute abdomen or other emergencies.  Likely mild dehydration or drop in blood sugar while standing in OR caused pt symptoms. Pregnancy test negative today.  GCC pending but pt denies risks.  Pt to f/u with OB/Gyn outpatient if desires to switch contraceptives or if RLQ pain persists.  Return to MAU as needed for emergencies. Pt discharged with strict precautions.  ASSESSMENT 1. Dizziness   2. Nausea   3. Near syncope     PLAN Discharge home Allergies as of 07/10/2017      Reactions   Bee Venom Swelling   Other Hives   Bell peppers   Codeine Hives, Nausea Only, Rash      Medication List    STOP taking these medications   promethazine 25 MG tablet Commonly known as:  PHENERGAN   traMADol 50 MG tablet Commonly known as:  ULTRAM     TAKE these medications   multivitamin with minerals Tabs tablet Take 1 tablet by mouth daily.   norgestimate-ethinyl estradiol 0.25-35 MG-MCG tablet Commonly known as:  ORTHO-CYCLEN,SPRINTEC,PREVIFEM Take 1 tablet by mouth daily.      Follow-up Information    Your OB/Gyn provider Follow up.   Why:  See list provided, return to MAU as needed for gyn emergencies          Fatima Blank Certified Nurse-Midwife 07/10/2017  4:38 PM

## 2017-07-10 NOTE — Discharge Instructions (Signed)
[facility] Providers  ° ° °[facility]       Phone: [phone] ° °[facility] Phone: [phone] ° °[facility] [place]  Phone: [phone] ° °[facility] [place]  Phone: [phone] ° °[facility] [place]  Phone: [phone] ° °[facility]       Phone: [phone] ° °[facility]    Phone: [phone]  ° °[facility]    Phone: [phone] ° °[facility]    Phone: [phone] ° °[facility]    Phone: [phone] ° °[facility]    Phone: [phone] ° °[facility]       Phone: [phone]  ° °[facility]  Phone: [phone] ° °[facility]    Phone: [phone] ° °[facility]    Phone: [phone] ° °[facility]      Phone: [phone] ° °[facility]    Phone: [phone] ° °

## 2017-07-10 NOTE — MAU Note (Signed)
Pt. Presents with "all over body aches", vaginal discharge, and lower abd. Discomfort. 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 Nausea, diarrhea x1.

## 2017-07-11 LAB — GC/CHLAMYDIA PROBE AMP (~~LOC~~) NOT AT ARMC
CHLAMYDIA, DNA PROBE: NEGATIVE
NEISSERIA GONORRHEA: NEGATIVE

## 2017-08-15 ENCOUNTER — Encounter: Payer: Self-pay | Admitting: Obstetrics & Gynecology

## 2017-08-15 ENCOUNTER — Other Ambulatory Visit (HOSPITAL_COMMUNITY)
Admission: RE | Admit: 2017-08-15 | Discharge: 2017-08-15 | Disposition: A | Discharge status: Home / Self Care | Payer: PRIVATE HEALTH INSURANCE | Source: Ambulatory Visit | Attending: Obstetrics & Gynecology | Admitting: Obstetrics & Gynecology

## 2017-08-15 ENCOUNTER — Ambulatory Visit (INDEPENDENT_AMBULATORY_CARE_PROVIDER_SITE_OTHER): Payer: PRIVATE HEALTH INSURANCE | Admitting: Obstetrics & Gynecology

## 2017-08-15 VITALS — BP 122/64 | Ht 65.0 in | Wt 215.0 lb

## 2017-08-15 DIAGNOSIS — Z01419 Encounter for gynecological examination (general) (routine) without abnormal findings: Secondary | ICD-10-CM | POA: Insufficient documentation

## 2017-08-15 DIAGNOSIS — F329 Major depressive disorder, single episode, unspecified: Secondary | ICD-10-CM | POA: Diagnosis not present

## 2017-08-15 DIAGNOSIS — F341 Dysthymic disorder: Secondary | ICD-10-CM

## 2017-08-15 MED ORDER — ESCITALOPRAM OXALATE 10 MG PO TABS: 10.0000 mg | ORAL_TABLET | Freq: Every day | ORAL | 11 refills | Status: DC

## 2017-08-15 MED ORDER — METAXALONE 800 MG PO TABS: 800.0000 mg | ORAL_TABLET | Freq: Three times a day (TID) | ORAL | 1 refills | Status: DC

## 2017-08-15 NOTE — Progress Notes (Signed)
Subjective:     Nancy Novak is a 26 y.o. female here for a routine exam.  Patient's last menstrual period was 07/06/2017. [id number] Birth Control Method:  OCP Menstrual Calendar(currently): regular  Current complaints: .   Current acute medical issues:     Recent Gynecologic History Patient's last menstrual period was 07/06/2017. Last Pap: 2017,  normal Last mammogram: ,    Past Medical History:  Diagnosis Date  . Asthma     Past Surgical History:  Procedure Laterality Date  . CHOLECYSTECTOMY N/A 04/26/2017   Procedure: LAPAROSCOPIC CHOLECYSTECTOMY;  Surgeon: Aviva Signs, MD;  Location: [facility];  Service: General;  Laterality: N/A;  . CYST EXCISION      OB History    Gravida Para Term Preterm AB Living   4 1 1  0 3 1   SAB TAB Ectopic Multiple Live Births   3 0 0 0 1      Obstetric Comments   Had an early pregnancy loss last summer      Social History   Socioeconomic History  . Marital status: Married    Spouse name: None  . Number of children: None  . Years of education: None  . Highest education level: None  Social Needs  . Financial resource strain: None  . Food insecurity - worry: None  . Food insecurity - inability: None  . Transportation needs - medical: None  . Transportation needs - non-medical: None  Occupational History  . None  Tobacco Use  . Smoking status: Former Research scientist (life sciences)  . Smokeless tobacco: Never Used  Substance and Sexual Activity  . Alcohol use: No  . Drug use: No  . Sexual activity: Yes    Birth control/protection: None  Other Topics Concern  . None  Social History Narrative  . None    Family History  Problem Relation Age of Onset  . Hypertension Mother   . Hypertension Paternal Grandmother   . Cancer Paternal Grandfather        lung     Current Outpatient Medications:  .  escitalopram (LEXAPRO) 10 MG tablet, Take 1 tablet (10 mg total) by mouth daily., Disp: 30 tablet, Rfl: 11 .  metaxalone (SKELAXIN) 800 MG tablet,  Take 1 tablet (800 mg total) by mouth 3 (three) times daily., Disp: 60 tablet, Rfl: 1 .  Multiple Vitamin (MULTIVITAMIN WITH MINERALS) TABS tablet, Take 1 tablet by mouth daily., Disp: , Rfl:  .  norgestimate-ethinyl estradiol (ORTHO-CYCLEN,SPRINTEC,PREVIFEM) 0.25-35 MG-MCG tablet, Take 1 tablet by mouth daily., Disp: , Rfl:   Review of Systems  Review of Systems  Constitutional: Negative for fever, chills, weight loss, malaise/fatigue and diaphoresis.  HENT: Negative for hearing loss, ear pain, nosebleeds, congestion, sore throat, neck pain, tinnitus and ear discharge.   Eyes: Negative for blurred vision, double vision, photophobia, pain, discharge and redness.  Respiratory: Negative for cough, hemoptysis, sputum production, shortness of breath, wheezing and stridor.   Cardiovascular: Negative for chest pain, palpitations, orthopnea, claudication, leg swelling and PND.  Gastrointestinal: negative for abdominal pain. Negative for heartburn, nausea, vomiting, diarrhea, constipation, blood in stool and melena.  Genitourinary: Negative for dysuria, urgency, frequency, hematuria and flank pain.  Musculoskeletal: Negative for myalgias, back pain, joint pain and falls.  Skin: Negative for itching and rash.  Neurological: Negative for dizziness, tingling, tremors, sensory change, speech change, focal weakness, seizures, loss of consciousness, weakness and headaches.  Endo/Heme/Allergies: Negative for environmental allergies and polydipsia. Does not bruise/bleed easily.  Psychiatric/Behavioral: Negative for depression, suicidal  ideas, hallucinations, memory loss and substance abuse. The patient is not nervous/anxious and does not have insomnia.        Objective:  Blood pressure 122/64, height 5\' 5"  (1.651 m), weight 215 lb (97.5 kg), last menstrual period 07/06/2017, unknown if currently breastfeeding.   Physical Exam  Vitals reviewed. Constitutional: She is oriented to person, place, and time. She  appears well-developed and well-nourished.  HENT:  Head: Normocephalic and atraumatic.        Right Ear: External ear normal.  Left Ear: External ear normal.  Nose: Nose normal.  Mouth/Throat: Oropharynx is clear and moist.  Eyes: Conjunctivae and EOM are normal. Pupils are equal, round, and reactive to light. Right eye exhibits no discharge. Left eye exhibits no discharge. No scleral icterus.  Neck: Normal range of motion. Neck supple. No tracheal deviation present. No thyromegaly present.  Cardiovascular: Normal rate, regular rhythm, normal heart sounds and intact distal pulses.  Exam reveals no gallop and no friction rub.   No murmur heard. Respiratory: Effort normal and breath sounds normal. No respiratory distress. She has no wheezes. She has no rales. She exhibits no tenderness.  GI: Soft. Bowel sounds are normal. She exhibits no distension and no mass. There is no tenderness. There is no rebound and no guarding.  Genitourinary:  Breasts no masses skin changes or nipple changes bilaterally      Vulva is normal without lesions Vagina is pink moist without discharge Cervix normal in appearance and pap is done Uterus is normal size shape and contour Adnexa is negative with normal sized ovaries   Musculoskeletal: Normal range of motion. She exhibits no edema and no tenderness.  Neurological: She is alert and oriented to person, place, and time. She has normal reflexes. She displays normal reflexes. No cranial nerve deficit. She exhibits normal muscle tone. Coordination normal.  Skin: Skin is warm and dry. No rash noted. No erythema. No pallor.  Psychiatric: She has a normal mood and affect. Her behavior is normal. Judgment and thought content normal.       Medications Ordered at today's visit: Meds ordered this encounter  Medications  . escitalopram (LEXAPRO) 10 MG tablet    Sig: Take 1 tablet (10 mg total) by mouth daily.    Dispense:  30 tablet    Refill:  11  . metaxalone  (SKELAXIN) 800 MG tablet    Sig: Take 1 tablet (800 mg total) by mouth 3 (three) times daily.    Dispense:  60 tablet    Refill:  1    Other orders placed at today's visit: No orders of the defined types were placed in this encounter.     Assessment:    Healthy female exam.   Depression, begin lexapro 10 Plan:    Follow up in: 2 weeks. IUD   Plan IUD placement on her menstrual cycle  Return in about 6 weeks (around 09/26/2017) for Follow up, with Dr Elonda Husky.

## 2017-08-20 LAB — CYTOLOGY - PAP: Diagnosis: NEGATIVE

## 2017-09-06 ENCOUNTER — Telehealth: Payer: Self-pay | Admitting: *Deleted

## 2017-09-06 ENCOUNTER — Other Ambulatory Visit: Payer: Self-pay | Admitting: Obstetrics & Gynecology

## 2017-09-06 MED ORDER — ESCITALOPRAM OXALATE 10 MG PO TABS: 10.0000 mg | ORAL_TABLET | Freq: Every day | ORAL | 3 refills | Status: DC

## 2017-09-06 NOTE — Telephone Encounter (Signed)
90 supply requested on Escitalopram.

## 2017-09-16 ENCOUNTER — Encounter: Payer: Self-pay | Admitting: Women's Health

## 2017-09-16 ENCOUNTER — Ambulatory Visit (INDEPENDENT_AMBULATORY_CARE_PROVIDER_SITE_OTHER): Payer: PRIVATE HEALTH INSURANCE | Admitting: Women's Health

## 2017-09-16 VITALS — BP 118/60 | HR 78 | Ht 65.0 in | Wt 208.0 lb

## 2017-09-16 DIAGNOSIS — Z30430 Encounter for insertion of intrauterine contraceptive device: Secondary | ICD-10-CM | POA: Insufficient documentation

## 2017-09-16 LAB — POCT URINE PREGNANCY: PREG TEST UR: NEGATIVE

## 2017-09-16 MED ORDER — LEVONORGESTREL 20 MCG/24HR IU IUD
INTRAUTERINE_SYSTEM | Freq: Once | INTRAUTERINE | Status: AC
  Administered 2017-09-16: 1 via INTRAUTERINE

## 2017-09-16 NOTE — Patient Instructions (Signed)
 Nothing in vagina for 3 days (no sex, douching, tampons, etc...)  Check your strings once a month to make sure you can feel them, if you are not able to please let us know  If you develop a fever of 100.4 or more in the next few weeks, or if you develop severe abdominal pain, please let us know  Use a backup method of birth control, such as condoms, for 2 weeks    Intrauterine Device Insertion, Care After This sheet gives you information about how to care for yourself after your procedure. Your health care provider may also give you more specific instructions. If you have problems or questions, contact your health care provider. What can I expect after the procedure? After the procedure, it is common to have:  Cramps and pain in the abdomen.  Light bleeding (spotting) or heavier bleeding that is like your menstrual period. This may last for up to a few days.  Lower back pain.  Dizziness.  Headaches.  Nausea.  Follow these instructions at home:  Before resuming sexual activity, check to make sure that you can feel the IUD string(s). You should be able to feel the end of the string(s) below the opening of your cervix. If your IUD string is in place, you may resume sexual activity. ? If you had a hormonal IUD inserted more than 7 days after your most recent period started, you will need to use a backup method of birth control for 7 days after IUD insertion. Ask your health care provider whether this applies to you.  Continue to check that the IUD is still in place by feeling for the string(s) after every menstrual period, or once a month.  Take over-the-counter and prescription medicines only as told by your health care provider.  Do not drive or use heavy machinery while taking prescription pain medicine.  Keep all follow-up visits as told by your health care provider. This is important. Contact a health care provider if:  You have bleeding that is heavier or lasts longer than  a normal menstrual cycle.  You have a fever.  You have cramps or abdominal pain that get worse or do not get better with medicine.  You develop abdominal pain that is new or is not in the same area of earlier cramping and pain.  You feel lightheaded or weak.  You have abnormal or bad-smelling discharge from your vagina.  You have pain during sexual activity.  You have any of the following problems with your IUD string(s): ? The string bothers or hurts you or your sexual partner. ? You cannot feel the string. ? The string has gotten longer.  You can feel the IUD in your vagina.  You think you may be pregnant, or you miss your menstrual period.  You think you may have an STI (sexually transmitted infection). Get help right away if:  You have flu-like symptoms.  You have a fever and chills.  You can feel that your IUD has slipped out of place. Summary  After the procedure, it is common to have cramps and pain in the abdomen. It is also common to have light bleeding (spotting) or heavier bleeding that is like your menstrual period.  Continue to check that the IUD is still in place by feeling for the string(s) after every menstrual period, or once a month.  Keep all follow-up visits as told by your health care provider. This is important.  Contact your health care provider if   you have problems with your IUD string(s), such as the string getting longer or bothering you or your sexual partner. This information is not intended to replace advice given to you by your health care provider. Make sure you discuss any questions you have with your health care provider. Document Released: 04/18/2011 Document Revised: 07/11/2016 Document Reviewed: 07/11/2016 Elsevier Interactive Patient Education  2017 [employer]  Levonorgestrel intrauterine device (IUD) What is this medicine? LEVONORGESTREL IUD (LEE voe nor jes trel) is a contraceptive (birth control) device. The device is placed  inside the uterus by a healthcare professional. It is used to prevent pregnancy. This device can also be used to treat heavy bleeding that occurs during your period. This medicine may be used for other purposes; ask your health care provider or pharmacist if you have questions. COMMON BRAND NAME(S): Kyleena, LILETTA, Mirena, Skyla What should I tell my health care provider before I take this medicine? They need to know if you have any of these conditions: -abnormal Pap smear -cancer of the breast, uterus, or cervix -diabetes -endometritis -genital or pelvic infection now or in the past -have more than one sexual partner or your partner has more than one partner -heart disease -history of an ectopic or tubal pregnancy -immune system problems -IUD in place -liver disease or tumor -problems with blood clots or take blood-thinners -seizures -use intravenous drugs -uterus of unusual shape -vaginal bleeding that has not been explained -an unusual or allergic reaction to levonorgestrel, other hormones, silicone, or polyethylene, medicines, foods, dyes, or preservatives -pregnant or trying to get pregnant -breast-feeding How should I use this medicine? This device is placed inside the uterus by a health care professional. Talk to your pediatrician regarding the use of this medicine in children. Special care may be needed. Overdosage: If you think you have taken too much of this medicine contact a poison control center or emergency room at once. NOTE: This medicine is only for you. Do not share this medicine with others. What if I miss a dose? This does not apply. Depending on the brand of device you have inserted, the device will need to be replaced every 3 to 5 years if you wish to continue using this type of birth control. What may interact with this medicine? Do not take this medicine with any of the following medications: -amprenavir -bosentan -fosamprenavir This medicine may also  interact with the following medications: -aprepitant -armodafinil -barbiturate medicines for inducing sleep or treating seizures -bexarotene -boceprevir -griseofulvin -medicines to treat seizures like carbamazepine, ethotoin, felbamate, oxcarbazepine, phenytoin, topiramate -modafinil -pioglitazone -rifabutin -rifampin -rifapentine -some medicines to treat HIV infection like atazanavir, efavirenz, indinavir, lopinavir, nelfinavir, tipranavir, ritonavir -St. John's wort -warfarin This list may not describe all possible interactions. Give your health care provider a list of all the medicines, herbs, non-prescription drugs, or dietary supplements you use. Also tell them if you smoke, drink alcohol, or use illegal drugs. Some items may interact with your medicine. What should I watch for while using this medicine? Visit your doctor or health care professional for regular check ups. See your doctor if you or your partner has sexual contact with others, becomes HIV positive, or gets a sexual transmitted disease. This product does not protect you against HIV infection (AIDS) or other sexually transmitted diseases. You can check the placement of the IUD yourself by reaching up to the top of your vagina with clean fingers to feel the threads. Do not pull on the threads. It is a good habit   to check placement after each menstrual period. Call your doctor right away if you feel more of the IUD than just the threads or if you cannot feel the threads at all. The IUD may come out by itself. You may become pregnant if the device comes out. If you notice that the IUD has come out use a backup birth control method like condoms and call your health care provider. Using tampons will not change the position of the IUD and are okay to use during your period. This IUD can be safely scanned with magnetic resonance imaging (MRI) only under specific conditions. Before you have an MRI, tell your healthcare provider that  you have an IUD in place, and which type of IUD you have in place. What side effects may I notice from receiving this medicine? Side effects that you should report to your doctor or health care professional as soon as possible: -allergic reactions like skin rash, itching or hives, swelling of the face, lips, or tongue -fever, flu-like symptoms -genital sores -high blood pressure -no menstrual period for 6 weeks during use -pain, swelling, warmth in the leg -pelvic pain or tenderness -severe or sudden headache -signs of pregnancy -stomach cramping -sudden shortness of breath -trouble with balance, talking, or walking -unusual vaginal bleeding, discharge -yellowing of the eyes or skin Side effects that usually do not require medical attention (report to your doctor or health care professional if they continue or are bothersome): -acne -breast pain -change in sex drive or performance -changes in weight -cramping, dizziness, or faintness while the device is being inserted -headache -irregular menstrual bleeding within first 3 to 6 months of use -nausea This list may not describe all possible side effects. Call your doctor for medical advice about side effects. You may report side effects to FDA at 1-800-FDA-1088. Where should I keep my medicine? This does not apply. NOTE: This sheet is a summary. It may not cover all possible information. If you have questions about this medicine, talk to your doctor, pharmacist, or health care provider.  2018 Elsevier/Gold Standard (2016-06-01 14:14:56)   

## 2017-09-16 NOTE — Progress Notes (Signed)
   IUD INSERTION Patient name: Nancy Novak MRN [id number]  Date of birth: October 28, 1990 Subjective Findings:   Nancy Novak is a 27 y.o. [phone] Caucasian female being seen today for insertion of a Mirena IUD.   Patient's last menstrual period was 08/29/2017 (exact date). Last sexual intercourse was 3wks ago Last pap12/13/18. Results were:  normal  The risks and benefits of the method and placement have been thouroughly reviewed with the patient and all questions were answered.  Specifically the patient is aware of failure rate of 09/998, expulsion of the IUD and of possible perforation.  The patient is aware of irregular bleeding due to the method and understands the incidence of irregular bleeding diminishes with time.  Signed copy of informed consent in chart.  Pertinent History Reviewed:   Reviewed past medical,surgical, social, obstetrical and family history.  Reviewed problem list, medications and allergies. Objective Findings & Procedure:   Vitals:   09/16/17 1523  BP: 118/60  Pulse: 78  Weight: 208 lb (94.3 kg)  Height: 5\' 5"  (1.651 m)  Body mass index is 34.61 kg/m.  Results for orders placed or performed in visit on 09/16/17 (from the past 24 hour(s))  POCT urine pregnancy   Collection Time: 09/16/17  3:26 PM  Result Value Ref Range   Preg Test, Ur Negative Negative     Time out was performed.  A graves speculum was placed in the vagina.  The cervix was visualized, prepped using Betadine, and grasped with a single tooth tenaculum. The uterus was found to be neutral and it sounded to 8 cm.  Mirena IUD placed per manufacturer's recommendations. The strings were trimmed to approximately 3 cm. The patient tolerated the procedure well.   Informal transvaginal sonogram was performed and the proper placement of the IUD was verified. Assessment & Plan:   1) Mirena IUD insertion The patient was given post procedure instructions, including signs and symptoms of infection  and to check for the strings after each menses or each month, and refraining from intercourse or anything in the vagina for 3 days. She was given a Mirena care card with date IUD placed, and date IUD to be removed. She is scheduled for a f/u appointment in 4 weeks.  Orders Placed This Encounter  Procedures  . POCT urine pregnancy    Return in about 4 weeks (around 10/14/2017) for F/U.  Tawnya Crook CNM, [facility] 09/16/2017 3:51 PM

## 2017-09-26 ENCOUNTER — Ambulatory Visit: Payer: PRIVATE HEALTH INSURANCE | Admitting: Obstetrics & Gynecology

## 2017-10-14 ENCOUNTER — Ambulatory Visit: Payer: PRIVATE HEALTH INSURANCE | Admitting: Women's Health

## 2017-10-28 ENCOUNTER — Ambulatory Visit (INDEPENDENT_AMBULATORY_CARE_PROVIDER_SITE_OTHER): Payer: PRIVATE HEALTH INSURANCE | Admitting: Women's Health

## 2017-10-28 ENCOUNTER — Encounter: Payer: Self-pay | Admitting: Women's Health

## 2017-10-28 VITALS — BP 112/70 | HR 76 | Ht 65.0 in | Wt 219.0 lb

## 2017-10-28 DIAGNOSIS — R519 Headache, unspecified: Secondary | ICD-10-CM

## 2017-10-28 DIAGNOSIS — N930 Postcoital and contact bleeding: Secondary | ICD-10-CM | POA: Diagnosis not present

## 2017-10-28 DIAGNOSIS — R102 Pelvic and perineal pain: Secondary | ICD-10-CM

## 2017-10-28 DIAGNOSIS — R1031 Right lower quadrant pain: Secondary | ICD-10-CM | POA: Diagnosis not present

## 2017-10-28 DIAGNOSIS — Z30431 Encounter for routine checking of intrauterine contraceptive device: Secondary | ICD-10-CM | POA: Diagnosis not present

## 2017-10-28 DIAGNOSIS — G4700 Insomnia, unspecified: Secondary | ICD-10-CM

## 2017-10-28 DIAGNOSIS — N760 Acute vaginitis: Secondary | ICD-10-CM

## 2017-10-28 DIAGNOSIS — R61 Generalized hyperhidrosis: Secondary | ICD-10-CM

## 2017-10-28 DIAGNOSIS — R51 Headache: Secondary | ICD-10-CM | POA: Diagnosis not present

## 2017-10-28 LAB — POCT WET PREP (WET MOUNT)
Clue Cells Wet Prep Whiff POC: POSITIVE
TRICHOMONAS WET PREP HPF POC: ABSENT

## 2017-10-28 MED ORDER — METRONIDAZOLE 0.75 % VA GEL: 1.0000 | Freq: Every day | VAGINAL | 0 refills | Status: DC

## 2017-10-28 NOTE — Progress Notes (Addendum)
GYN VISIT Patient name: Nancy Novak MRN [id number]  Date of birth: [birth year] Chief Complaint:   Follow-up (iud check)  History of Present Illness:   Nancy Novak is a 27 y.o. [phone] Caucasian female being seen today for IUD check. Had Mirena IUD inserted 09/16/17.  Bled x 2 weeks, now only bleeds after sex.  Denies abnormal discharge, itching/odor/irritation.  Has intermittent RLQ pain x 2-63mths, has h/o ovarian cysts. Reports knot behind Lt ear that seems to be getting bigger x 2-71mths, night sweats, headaches, insomnia. Has PCP but has a hard time getting appt, so thought she would mention everything today.   No LMP recorded. Patient is not currently having periods (Reason: IUD). The current method of family planning is IUD. Last pap 08/15/17. Results were:  normal Review of Systems:   Pertinent items are noted in HPI Denies fever/chills, dizziness, headaches, visual disturbances, fatigue, shortness of breath, chest pain, abdominal pain, vomiting, abnormal vaginal discharge/itching/odor/irritation, problems with periods, bowel movements, urination, or intercourse unless otherwise stated above.  Pertinent History Reviewed:  Reviewed past medical,surgical, social, obstetrical and family history.  Reviewed problem list, medications and allergies. Physical Assessment:   Vitals:   10/28/17 1521  BP: 112/70  Pulse: 76  Weight: 219 lb (99.3 kg)  Height: 5\' 5"  (1.651 m)  Body mass index is 36.44 kg/m.       Physical Examination:   General appearance: alert, well appearing, and in no distress  Mental status: alert, oriented to person, place, and time  Skin: warm & dry   Lt ear/jaw: no mass/knot palpable  Cardiovascular: normal heart rate noted  Respiratory: normal respiratory effort, no distress  Abdomen: soft, non-tender   Pelvic: IUD strings visible, tucked behind cx at 10 o'clock, IUD in correct fundal placement w/in endometrium via informal transvaginal u/s. Small amt  slightly malodorous bloody d/c  Extremities: no edema   Results for orders placed or performed in visit on 10/28/17 (from the past 24 hour(s))  POCT Wet Prep [facility])   Collection Time: 10/28/17  4:55 PM  Result Value Ref Range   Source Wet Prep POC vaginal    WBC, Wet Prep HPF POC none    Bacteria Wet Prep HPF POC Few Few   BACTERIA WET PREP MORPHOLOGY POC     Clue Cells Wet Prep HPF POC Few (A) None   Clue Cells Wet Prep Whiff POC Positive Whiff    Yeast Wet Prep HPF POC None    KOH Wet Prep POC     Trichomonas Wet Prep HPF POC Absent Absent    Assessment & Plan:  1) Mirena IUD check> correct placement  2) Postcoital bleeding  3) Mild BV> may be cause of postcoital bleeding, rx metrogel qhs x 5nights  4) RLQ> will get pelvic u/s  5) Insomnia> will check labs  6) Night sweats> will check labs  7) Headaches> will check labs  8) Knot behind Lt ear> I am unable to feel on exam, pt to monitor   Meds:  Meds ordered this encounter  Medications  . metroNIDAZOLE (METROGEL VAGINAL) 0.75 % vaginal gel    Sig: Place 1 Applicatorful vaginally at bedtime. X 5 nights, no alcohol or sex while using    Dispense:  70 g    Refill:  0    Order Specific Question:   Supervising Provider    Answer:   Tania Ade H [[id number]]    Orders Placed This Encounter  Procedures  . US  PELVIS (TRANSABDOMINAL ONLY)  . US PELVIS TRANSVANGINAL NON-OB (TV ONLY)  . CBC  . Comprehensive metabolic panel  . TSH  . POCT Wet Prep [facility])    Return for within next few weeks for pelvic u/s.  [place], [facility] 10/28/2017 5:06 PM

## 2017-10-29 LAB — COMPREHENSIVE METABOLIC PANEL
A/G RATIO: 1.4 (ref 1.2–2.2)
ALBUMIN: 4.4 g/dL (ref 3.5–5.5)
ALT: 34 IU/L — ABNORMAL HIGH (ref 0–32)
AST: 18 IU/L (ref 0–40)
Alkaline Phosphatase: 127 IU/L — ABNORMAL HIGH (ref 39–117)
BUN / CREAT RATIO: 10 (ref 9–23)
BUN: 10 mg/dL (ref 6–20)
Bilirubin Total: <0.2 mg/dL (ref 0.0–1.2)
CALCIUM: 9.3 mg/dL (ref 8.7–10.2)
CO2: 25 mmol/L (ref 20–29)
Chloride: 100 mmol/L (ref 96–106)
Creatinine, Ser: 1.02 mg/dL — ABNORMAL HIGH (ref 0.57–1.00)
GFR, EST AFRICAN AMERICAN: 88 mL/min/{1.73_m2} (ref >59)
GFR, EST NON AFRICAN AMERICAN: 76 mL/min/{1.73_m2} (ref >59)
GLOBULIN, TOTAL: 3.1 g/dL (ref 1.5–4.5)
Glucose: 65 mg/dL (ref 65–99)
POTASSIUM: 4 mmol/L (ref 3.5–5.2)
SODIUM: 142 mmol/L (ref 134–144)
Total Protein: 7.5 g/dL (ref 6.0–8.5)

## 2017-10-29 LAB — CBC
HEMATOCRIT: 40.5 % (ref 34.0–46.6)
Hemoglobin: 13.3 g/dL (ref 11.1–15.9)
MCH: 28.8 pg (ref 26.6–33.0)
MCHC: 32.8 g/dL (ref 31.5–35.7)
MCV: 88 fL (ref 79–97)
Platelets: 368 10*3/uL (ref 150–379)
RBC: 4.62 x10E6/uL (ref 3.77–5.28)
RDW: 15.1 % (ref 12.3–15.4)
WBC: 11.9 10*3/uL — AB (ref 3.4–10.8)

## 2017-10-29 LAB — TSH: TSH: 1.45 u[IU]/mL (ref 0.450–4.500)

## 2017-10-31 ENCOUNTER — Other Ambulatory Visit: Payer: Self-pay | Admitting: Women's Health

## 2017-10-31 DIAGNOSIS — R7989 Other specified abnormal findings of blood chemistry: Secondary | ICD-10-CM

## 2017-11-11 ENCOUNTER — Other Ambulatory Visit: Payer: PRIVATE HEALTH INSURANCE

## 2017-11-14 ENCOUNTER — Ambulatory Visit (INDEPENDENT_AMBULATORY_CARE_PROVIDER_SITE_OTHER): Payer: PRIVATE HEALTH INSURANCE

## 2017-11-14 ENCOUNTER — Other Ambulatory Visit: Payer: Self-pay | Admitting: Women's Health

## 2017-11-14 DIAGNOSIS — R102 Pelvic and perineal pain: Secondary | ICD-10-CM | POA: Diagnosis not present

## 2017-11-14 DIAGNOSIS — R1032 Left lower quadrant pain: Secondary | ICD-10-CM | POA: Diagnosis not present

## 2017-11-14 DIAGNOSIS — R1031 Right lower quadrant pain: Secondary | ICD-10-CM

## 2017-11-14 DIAGNOSIS — Z30431 Encounter for routine checking of intrauterine contraceptive device: Secondary | ICD-10-CM | POA: Diagnosis not present

## 2017-11-14 NOTE — Progress Notes (Signed)
PELVIC ULTRASOUND:homogeneous anteverted uterus,normal,IUD is centrally located with in the endometrium,endometrium 3.4 mm,normal ovaries bilateral,dominate simple follicle left ovary 2.4 x 2.1 x 2.3 cm,no free fluid,no pain during ultrasound,ovaries appear mobile

## 2017-11-18 ENCOUNTER — Encounter (HOSPITAL_COMMUNITY): Payer: Self-pay | Admitting: Emergency Medicine

## 2017-11-18 ENCOUNTER — Emergency Department (HOSPITAL_COMMUNITY)
Admission: EM | Admit: 2017-11-18 | Discharge: 2017-11-18 | Disposition: A | Discharge status: Home / Self Care | Payer: PRIVATE HEALTH INSURANCE | Attending: Emergency Medicine | Admitting: Emergency Medicine

## 2017-11-18 DIAGNOSIS — J45909 Unspecified asthma, uncomplicated: Secondary | ICD-10-CM | POA: Insufficient documentation

## 2017-11-18 DIAGNOSIS — Z79899 Other long term (current) drug therapy: Secondary | ICD-10-CM | POA: Diagnosis not present

## 2017-11-18 DIAGNOSIS — Z87891 Personal history of nicotine dependence: Secondary | ICD-10-CM | POA: Insufficient documentation

## 2017-11-18 DIAGNOSIS — R1031 Right lower quadrant pain: Secondary | ICD-10-CM | POA: Insufficient documentation

## 2017-11-18 DIAGNOSIS — R110 Nausea: Secondary | ICD-10-CM | POA: Diagnosis not present

## 2017-11-18 DIAGNOSIS — R109 Unspecified abdominal pain: Secondary | ICD-10-CM

## 2017-11-18 LAB — URINALYSIS, ROUTINE W REFLEX MICROSCOPIC
BILIRUBIN URINE: NEGATIVE
GLUCOSE, UA: NEGATIVE mg/dL
Ketones, ur: NEGATIVE mg/dL
LEUKOCYTES UA: NEGATIVE
NITRITE: NEGATIVE
PH: 5 (ref 5.0–8.0)
Protein, ur: NEGATIVE mg/dL
SPECIFIC GRAVITY, URINE: 1.028 (ref 1.005–1.030)

## 2017-11-18 LAB — COMPREHENSIVE METABOLIC PANEL
ALK PHOS: 112 U/L (ref 38–126)
ALT: 26 U/L (ref 14–54)
ANION GAP: 10 (ref 5–15)
AST: 19 U/L (ref 15–41)
Albumin: 3.9 g/dL (ref 3.5–5.0)
BUN: 11 mg/dL (ref 6–20)
CHLORIDE: 105 mmol/L (ref 101–111)
CO2: 27 mmol/L (ref 22–32)
Calcium: 8.7 mg/dL — ABNORMAL LOW (ref 8.9–10.3)
Creatinine, Ser: 0.73 mg/dL (ref 0.44–1.00)
GFR calc non Af Amer: >60 mL/min (ref >60)
Glucose, Bld: 89 mg/dL (ref 65–99)
Potassium: 3.8 mmol/L (ref 3.5–5.1)
SODIUM: 142 mmol/L (ref 135–145)
Total Bilirubin: 0.3 mg/dL (ref 0.3–1.2)
Total Protein: 8.2 g/dL — ABNORMAL HIGH (ref 6.5–8.1)

## 2017-11-18 LAB — CBC
HCT: 43.9 % (ref 36.0–46.0)
HEMOGLOBIN: 13.9 g/dL (ref 12.0–15.0)
MCH: 28.7 pg (ref 26.0–34.0)
MCHC: 31.7 g/dL (ref 30.0–36.0)
MCV: 90.5 fL (ref 78.0–100.0)
Platelets: 328 10*3/uL (ref 150–400)
RBC: 4.85 MIL/uL (ref 3.87–5.11)
RDW: 14.5 % (ref 11.5–15.5)
WBC: 14.1 10*3/uL — ABNORMAL HIGH (ref 4.0–10.5)

## 2017-11-18 LAB — PREGNANCY, URINE: Preg Test, Ur: NEGATIVE

## 2017-11-18 MED ORDER — KETOROLAC TROMETHAMINE 30 MG/ML IJ SOLN
30.0000 mg | Freq: Once | INTRAMUSCULAR | Status: AC
  Administered 2017-11-18: 30 mg via INTRAVENOUS
  Filled 2017-11-18: qty 1

## 2017-11-18 MED ORDER — SODIUM CHLORIDE 0.9 % IV SOLN
INTRAVENOUS | Status: DC
  Administered 2017-11-18: 14:00:00 via INTRAVENOUS

## 2017-11-18 NOTE — ED Provider Notes (Addendum)
[place] DEPT Provider Note   CSN: [id number] Arrival date & time: 11/18/17  1301     History   Chief Complaint Chief Complaint  Patient presents with  . Flank Pain    R side    HPI Nancy Novak is a 27 y.o. female.  Patient c/o right flank pain for the past week. Pain is intermittent, waxing and waning, moderate to severe at times, radiates from right flank posteriorly towards r lower abd. Denies hx kidney stones. States was recent evaluated at [facility] ED with CT - pt unsure of results. Has IUD, so states unsure of last normal period. No pelvic pain. Denies vaginal discharge or bleeding. ?dysuria. No hematuria. Denies anterior/abd pain or tenderness. Denies cough or uri c/o. No chest pain or sob. No fever or chills.    The history is provided by the patient.  Flank Pain  Pertinent negatives include no chest pain and no shortness of breath.    Past Medical History:  Diagnosis Date  . Asthma     Patient Active Problem List   Diagnosis Date Noted  . Encounter for insertion of mirena IUD 09/16/2017  . Chronic cholecystitis     Past Surgical History:  Procedure Laterality Date  . CHOLECYSTECTOMY N/A 04/26/2017   Procedure: LAPAROSCOPIC CHOLECYSTECTOMY;  Surgeon: Aviva Signs, MD;  Location: [facility];  Service: General;  Laterality: N/A;  . CYST EXCISION      OB History    Gravida Para Term Preterm AB Living   4 1 1  0 3 1   SAB TAB Ectopic Multiple Live Births   3 0 0 0 1      Obstetric Comments   Had an early pregnancy loss last summer       Home Medications    Prior to Admission medications   Medication Sig Start Date End Date Taking? Authorizing Provider  escitalopram (LEXAPRO) 10 MG tablet Take 1 tablet (10 mg total) by mouth daily. 09/06/17   Florian Buff, MD  metaxalone (SKELAXIN) 800 MG tablet Take 1 tablet (800 mg total) by mouth 3 (three) times daily. Patient not taking: Reported on 09/16/2017 08/15/17    Florian Buff, MD  metroNIDAZOLE (METROGEL VAGINAL) 0.75 % vaginal gel Place 1 Applicatorful vaginally at bedtime. X 5 nights, no alcohol or sex while using 10/28/17   Roma Schanz, CNM  Multiple Vitamin (MULTIVITAMIN WITH MINERALS) TABS tablet Take 1 tablet by mouth daily.    [provider]  norgestimate-ethinyl estradiol (ORTHO-CYCLEN,SPRINTEC,PREVIFEM) 0.25-35 MG-MCG tablet Take 1 tablet by mouth daily.    [provider]    Family History Family History  Problem Relation Age of Onset  . Hypertension Mother   . Hypertension Paternal Grandmother   . Cancer Paternal Grandfather        lung    Social History Social History   Tobacco Use  . Smoking status: Former Research scientist (life sciences)  . Smokeless tobacco: Never Used  Substance Use Topics  . Alcohol use: No  . Drug use: No     Allergies   Bee venom; Other; and Codeine   Review of Systems Review of Systems  Constitutional: Negative for fever.  HENT: Negative for sore throat.   Eyes: Negative for redness.  Respiratory: Negative for shortness of breath.   Cardiovascular: Negative for chest pain.  Gastrointestinal: Positive for nausea.  Genitourinary: Positive for flank pain. Negative for vaginal bleeding and vaginal discharge.  Musculoskeletal: Positive for back pain.  Skin:  Negative for rash.  Neurological: Negative for weakness and numbness.  Hematological: Does not bruise/bleed easily.  Psychiatric/Behavioral: Negative for confusion.     Physical Exam Updated Vital Signs BP 132/62 (BP Location: Left Arm)   Pulse (!) 117   Temp 99.1 F (37.3 C) (Oral)   Resp 20   SpO2 98%   Physical Exam  Constitutional: She appears well-developed and well-nourished. No distress.  HENT:  Head: Atraumatic.  Eyes: Conjunctivae are normal. No scleral icterus.  Neck: Neck supple. No tracheal deviation present.  Cardiovascular: Normal rate, regular rhythm, normal heart sounds and intact distal pulses.    Pulmonary/Chest: Effort normal and breath sounds normal. No respiratory distress.  Abdominal: Soft. Normal appearance and bowel sounds are normal. She exhibits no distension and no mass. There is no tenderness. There is no guarding.  Genitourinary:  Genitourinary Comments: No cva tenderness  Musculoskeletal: She exhibits no edema.  TLS spine non tender, aligned.   Neurological: She is alert.  Skin: Skin is warm and dry. No rash noted. She is not diaphoretic.  Psychiatric: She has a normal mood and affect.  Nursing note and vitals reviewed.    ED Treatments / Results  Labs (all labs ordered are listed, but only abnormal results are displayed) Results for orders placed or performed during the hospital encounter of 11/18/17  Urinalysis, Routine w reflex microscopic- may I&O cath if menses  Result Value Ref Range   Color, Urine YELLOW YELLOW   APPearance CLEAR CLEAR   Specific Gravity, Urine 1.028 1.005 - 1.030   pH 5.0 5.0 - 8.0   Glucose, UA NEGATIVE NEGATIVE mg/dL   Hgb urine dipstick MODERATE (A) NEGATIVE   Bilirubin Urine NEGATIVE NEGATIVE   Ketones, ur NEGATIVE NEGATIVE mg/dL   Protein, ur NEGATIVE NEGATIVE mg/dL   Nitrite NEGATIVE NEGATIVE   Leukocytes, UA NEGATIVE NEGATIVE   RBC / HPF 0-5 0 - 5 RBC/hpf   WBC, UA 0-5 0 - 5 WBC/hpf   Bacteria, UA RARE (A) NONE SEEN   Squamous Epithelial / LPF 0-5 (A) NONE SEEN   Mucus PRESENT   Pregnancy, urine  Result Value Ref Range   Preg Test, Ur NEGATIVE NEGATIVE  Comprehensive metabolic panel  Result Value Ref Range   Sodium 142 135 - 145 mmol/L   Potassium 3.8 3.5 - 5.1 mmol/L   Chloride 105 101 - 111 mmol/L   CO2 27 22 - 32 mmol/L   Glucose, Bld 89 65 - 99 mg/dL   BUN 11 6 - 20 mg/dL   Creatinine, Ser 0.73 0.44 - 1.00 mg/dL   Calcium 8.7 (L) 8.9 - 10.3 mg/dL   Total Protein 8.2 (H) 6.5 - 8.1 g/dL   Albumin 3.9 3.5 - 5.0 g/dL   AST 19 15 - 41 U/L   ALT 26 14 - 54 U/L   Alkaline Phosphatase 112 38 - 126 U/L   Total  Bilirubin 0.3 0.3 - 1.2 mg/dL   GFR calc non Af Amer >60 >60 mL/min   GFR calc Af Amer >60 >60 mL/min   Anion gap 10 5 - 15   US Pelvis Transvanginal Non-ob (tv Only)  Result Date: 11/15/2017 GYNECOLOGIC SONOGRAM Nancy Novak is a 27 y.o. [phone] unknown LMP,she is here for a pelvic sonogram for left lower quadrant pain. Uterus                      9.4 x 4 x 5.5 cm, homogeneous anteverted uterus,normal Endometrium  3.4 mm, symmetrical, IUD is centrally located with in the endometrium Right ovary             2.5 x 2.5 x 1.8 cm, normal right ovary Left ovary                4.4 x 3.8 x 2.3 cm, normal left ovary,dominate simple follicle left ovary 2.4 x 2.1 x 2.3 cm No free fluid Technician Comments: PELVIC ULTRASOUND:homogeneous anteverted uterus,normal,IUD is centrally located with in the endometrium,endometrium 3.4 mm,normal ovaries bilateral,dominate simple follicle left ovary 2.4 x 2.1 x 2.3 cm,no free fluid,no pain during ultrasound,ovaries appear mobile [employer] 11/14/2017 5:11 PM Clinical Impression and recommendations: I have reviewed the sonogram results above, combined with the patient's current clinical course, below are my impressions and any appropriate recommendations for management based on the sonographic findings. Normal uterus with properly located IUD with relatively thin endometrium due to the IUD suppression Right ovary normal Left ovary normal with physiological follicular cyst No pain during exam and ovaries moved properly Florian Buff 11/15/2017 9:13 AM   US Pelvis (transabdominal Only)  Result Date: 11/15/2017 GYNECOLOGIC SONOGRAM Nancy Novak is a 27 y.o. [phone] unknown LMP,she is here for a pelvic sonogram for left lower quadrant pain. Uterus                      9.4 x 4 x 5.5 cm, homogeneous anteverted uterus,normal Endometrium          3.4 mm, symmetrical, IUD is centrally located with in the endometrium Right ovary             2.5 x 2.5 x 1.8 cm, normal right  ovary Left ovary                4.4 x 3.8 x 2.3 cm, normal left ovary,dominate simple follicle left ovary 2.4 x 2.1 x 2.3 cm No free fluid Technician Comments: PELVIC ULTRASOUND:homogeneous anteverted uterus,normal,IUD is centrally located with in the endometrium,endometrium 3.4 mm,normal ovaries bilateral,dominate simple follicle left ovary 2.4 x 2.1 x 2.3 cm,no free fluid,no pain during ultrasound,ovaries appear mobile [employer] 11/14/2017 5:11 PM Clinical Impression and recommendations: I have reviewed the sonogram results above, combined with the patient's current clinical course, below are my impressions and any appropriate recommendations for management based on the sonographic findings. Normal uterus with properly located IUD with relatively thin endometrium due to the IUD suppression Right ovary normal Left ovary normal with physiological follicular cyst No pain during exam and ovaries moved properly Florian Buff 11/15/2017 9:13 AM    EKG  EKG Interpretation None       Radiology No results found.  Procedures Procedures (including critical care time)  Medications Ordered in ED Medications  0.9 %  sodium chloride infusion (not administered)     Initial Impression / Assessment and Plan / ED Course  I have reviewed the triage vital signs and the nursing notes.  Pertinent labs & imaging results that were available during my care of the patient were reviewed by me and considered in my medical decision making (see chart for details).  UA sent.   Reviewed nursing notes and prior charts for additional history. Sent for recent labs and CT from outside facility where pt indicates was recently evaluated for similar symptoms.   Labs reviewed - no uti on labs.   Recent imaging studies reviewed - from [facility] scan [facility] this month for same symptoms -  neg for acute process, note was made of moderate amount stool in colon.   Pt denies constipation, states bm yesterday.   Recent  pelvic u/s also negative for acute process.   todays labs unremarkable. Afebrile. abd soft nt.   ?whether possibly musculoskeletal, vs whether w waxing/waning nature possibly due to bowel/blowel gas type of pain.   Signed out to Dr Thomasene Lot to check labs when resulted - if ok, anticipate d/c of patient with close outpt pcp f/u.     Final Clinical Impressions(s) / ED Diagnoses   Final diagnoses:  None    ED Discharge Orders    None         Lajean Saver, MD 11/18/17 [phone]

## 2017-11-18 NOTE — ED Notes (Signed)
SENT TO MAIN LAB: LIGHT GREEN, DARK GREEN, LAVENDER, BLUE, AND A GOLD TOP TO SAVE.

## 2017-11-18 NOTE — ED Provider Notes (Signed)
Patient's white cell count mildly elevated.  Otherwise labs reassuring.  Patient continues have normal vital signs physical exam.  Taking p.o. and appears very well at time of discharge.  Discussed that it could be a CT stone that was not seen on CT or when that is passed.  Patient will return immediately with fever, or other concerns.  We discussed diagnostic uncertainty medicine and she is aware that she will return with any concerns.   Macarthur Critchley, MD 11/18/17 1927

## 2017-11-18 NOTE — Discharge Instructions (Signed)
Very unsure what is causing your pain today.  Again if you have any fever, or pain, or other focal symptoms.  Please return immediately to emergency department.  It is very possible you passed a kidney stone or you you still have one that was not seen on CAT scan the other day.  Please rest, stay hydrated, and return to follow-up with PCP.

## 2017-11-18 NOTE — ED Triage Notes (Signed)
Pt c/o R flank intermittent x 1 week, pt states pain became unbearable today and dysuria. Pt had recently evaluated at [facility] for similar symptoms, had CT and suspected stone, but patient unsure of results.

## 2017-11-18 NOTE — ED Notes (Signed)
Patient ambulated to the restroom with husband and back to stretcher. Pt tolerated it well.

## 2017-11-18 NOTE — ED Notes (Signed)
Bed: PR91 Expected date:  Expected time:  Means of arrival:  Comments: Hold triage 1

## 2017-11-28 ENCOUNTER — Ambulatory Visit: Payer: PRIVATE HEALTH INSURANCE | Admitting: Women's Health

## 2018-01-17 ENCOUNTER — Encounter: Payer: Self-pay | Admitting: Obstetrics & Gynecology

## 2018-10-31 DIAGNOSIS — M5416 Radiculopathy, lumbar region: Secondary | ICD-10-CM | POA: Insufficient documentation

## 2018-10-31 DIAGNOSIS — M533 Sacrococcygeal disorders, not elsewhere classified: Secondary | ICD-10-CM | POA: Insufficient documentation

## 2018-10-31 DIAGNOSIS — M5136 Other intervertebral disc degeneration, lumbar region: Secondary | ICD-10-CM | POA: Insufficient documentation

## 2019-06-22 ENCOUNTER — Other Ambulatory Visit: Payer: Self-pay

## 2019-06-22 ENCOUNTER — Ambulatory Visit (INDEPENDENT_AMBULATORY_CARE_PROVIDER_SITE_OTHER): Payer: PRIVATE HEALTH INSURANCE | Admitting: Women's Health

## 2019-06-22 ENCOUNTER — Other Ambulatory Visit (HOSPITAL_COMMUNITY)
Admission: RE | Admit: 2019-06-22 | Discharge: 2019-06-22 | Disposition: A | Discharge status: Home / Self Care | Payer: PRIVATE HEALTH INSURANCE | Source: Ambulatory Visit | Attending: Obstetrics & Gynecology | Admitting: Obstetrics & Gynecology

## 2019-06-22 ENCOUNTER — Encounter: Payer: Self-pay | Admitting: Women's Health

## 2019-06-22 VITALS — BP 123/83 | HR 84 | Ht 65.0 in | Wt 235.0 lb

## 2019-06-22 DIAGNOSIS — N6452 Nipple discharge: Secondary | ICD-10-CM | POA: Diagnosis not present

## 2019-06-22 DIAGNOSIS — R635 Abnormal weight gain: Secondary | ICD-10-CM

## 2019-06-22 DIAGNOSIS — N926 Irregular menstruation, unspecified: Secondary | ICD-10-CM | POA: Diagnosis not present

## 2019-06-22 DIAGNOSIS — R102 Pelvic and perineal pain: Secondary | ICD-10-CM | POA: Diagnosis present

## 2019-06-22 DIAGNOSIS — L680 Hirsutism: Secondary | ICD-10-CM

## 2019-06-22 NOTE — Progress Notes (Signed)
   GYN VISIT Patient name: Nancy Novak MRN [id number]  Date of birth: [birth year] Chief Complaint:   Breast Discharge (bleeding and cramping with IUD)  History of Present Illness:   Nancy Novak is a 28 y.o. [phone] Caucasian female being seen today for breast discharge and bleeding/cramping. Started noticing nipple discharge 'like colostrum' from both breasts in May. No change in meds, not on any daily meds, no THC, no recent excessive stimulation. Denies any lumps/bumps. Does have 28yo she breastfed.  Mirena IUD inserted 09/16/17. No bleeding until Aug of this year, started having irregular bleeding, daily cramping, Rt >Lt. Denies abnormal discharge, itching/odor/irritation.  No change in sex partners. Also reports ~15lb wt gain in 72mth, hair growth chin/neck, breasts, abd.   No LMP recorded. (Menstrual status: IUD). The current method of family planning is IUD.  Last pap 08/15/17. Results were:  normal Review of Systems:   Pertinent items are noted in HPI Denies fever/chills, dizziness, headaches, visual disturbances, fatigue, shortness of breath, chest pain, abdominal pain, vomiting, abnormal vaginal discharge/itching/odor/irritation, problems with periods, bowel movements, urination, or intercourse unless otherwise stated above.  Pertinent History Reviewed:  Reviewed past medical,surgical, social, obstetrical and family history.  Reviewed problem list, medications and allergies. Physical Assessment:   Vitals:   06/22/19 0951  BP: 123/83  Pulse: 84  Weight: 235 lb (106.6 kg)  Height: 5\' 5"  (1.651 m)  Body mass index is 39.11 kg/m.       Physical Examination:   General appearance: alert, well appearing, and in no distress  Mental status: alert, oriented to person, place, and time  Skin: warm & dry   Cardiovascular: normal heart rate noted  Respiratory: normal respiratory effort, no distress  Breasts - prolactin drawn before exam. Breasts appear normal, no suspicious masses,  no skin or nipple changes or axillary node. Pt expressed drop of cloudy white discharge from Rt nipple, slide obtained   Abdomen: soft, non-tender   Pelvic: VULVA: normal appearing vulva with no masses, tenderness or lesions, VAGINA: normal appearing vagina with normal color and discharge, no lesions, CERVIX: normal appearing cervix without discharge or lesions, IUD strings visible and appropriate length UTERUS: uterus is normal size, shape, consistency and nontender, ADNEXA: mild tenderness Rt, no abnormal masses felt  Extremities: no edema   Informal transabdominal u/s: IUD in correct placement  No results found for this or any previous visit (from the past 24 hour(s)).  Assessment & Plan:  1) Bilateral nipple discharge> prolactin, TSH, slide sent to cytology, normal breast exam  2) Cramping/bleeding> vaginal swab obtained, IUD in correct position  3) Weight gain, hirsutism> TSH, discussed possibility of PCOS, will discuss further when labs back. She is interested in weight loss meds if labs normal  Meds: No orders of the defined types were placed in this encounter.   Orders Placed This Encounter  Procedures  . TSH  . Prolactin    Return in about 1 year (around 06/21/2020) for Pap & physical.  Roma Schanz CNM, WHNP-BC 06/22/2019 11:11 AM

## 2019-06-22 NOTE — Addendum Note (Signed)
Addended by: Linton Rump on: 06/22/2019 11:19 AM   Modules accepted: Orders

## 2019-06-23 LAB — CYTOLOGY - NON PAP

## 2019-06-23 LAB — PROLACTIN: Prolactin: 8.7 ng/mL (ref 4.8–23.3)

## 2019-06-23 LAB — TSH: TSH: 1.49 u[IU]/mL (ref 0.450–4.500)

## 2019-06-25 LAB — CERVICOVAGINAL ANCILLARY ONLY
Bacterial Vaginitis (gardnerella): NEGATIVE
Candida Glabrata: NEGATIVE
Candida Vaginitis: NEGATIVE
Chlamydia: NEGATIVE
Comment: NEGATIVE
Comment: NEGATIVE
Comment: NEGATIVE
Comment: NEGATIVE
Comment: NEGATIVE
Comment: NORMAL
Neisseria Gonorrhea: NEGATIVE
Trichomonas: NEGATIVE

## 2019-06-30 ENCOUNTER — Other Ambulatory Visit: Payer: Self-pay | Admitting: Women's Health

## 2019-07-01 ENCOUNTER — Other Ambulatory Visit: Payer: Self-pay | Admitting: Women's Health

## 2019-07-01 MED ORDER — ORILISSA 200 MG PO TABS: 200.0000 mg | ORAL_TABLET | Freq: Two times a day (BID) | ORAL | 5 refills | Status: DC

## 2019-11-09 ENCOUNTER — Other Ambulatory Visit: Payer: Self-pay

## 2019-11-09 ENCOUNTER — Ambulatory Visit (INDEPENDENT_AMBULATORY_CARE_PROVIDER_SITE_OTHER): Payer: PRIVATE HEALTH INSURANCE | Admitting: Adult Health

## 2019-11-09 ENCOUNTER — Encounter: Payer: Self-pay | Admitting: Adult Health

## 2019-11-09 ENCOUNTER — Other Ambulatory Visit (HOSPITAL_COMMUNITY)
Admission: RE | Admit: 2019-11-09 | Discharge: 2019-11-09 | Disposition: A | Discharge status: Home / Self Care | Payer: PRIVATE HEALTH INSURANCE | Source: Ambulatory Visit | Attending: Adult Health | Admitting: Adult Health

## 2019-11-09 VITALS — BP 114/79 | HR 85 | Ht 65.0 in | Wt 237.5 lb

## 2019-11-09 DIAGNOSIS — Z01419 Encounter for gynecological examination (general) (routine) without abnormal findings: Secondary | ICD-10-CM | POA: Insufficient documentation

## 2019-11-09 DIAGNOSIS — Z6839 Body mass index (BMI) 39.0-39.9, adult: Secondary | ICD-10-CM

## 2019-11-09 DIAGNOSIS — Z713 Dietary counseling and surveillance: Secondary | ICD-10-CM

## 2019-11-09 DIAGNOSIS — Z975 Presence of (intrauterine) contraceptive device: Secondary | ICD-10-CM

## 2019-11-09 DIAGNOSIS — L739 Follicular disorder, unspecified: Secondary | ICD-10-CM

## 2019-11-09 DIAGNOSIS — K5900 Constipation, unspecified: Secondary | ICD-10-CM

## 2019-11-09 MED ORDER — PHENTERMINE HCL 37.5 MG PO CAPS: 37.5000 mg | ORAL_CAPSULE | ORAL | 0 refills | Status: DC

## 2019-11-09 MED ORDER — LINACLOTIDE 145 MCG PO CAPS: 145.0000 ug | ORAL_CAPSULE | Freq: Every day | ORAL | 3 refills | Status: DC

## 2019-11-09 MED ORDER — SULFAMETHOXAZOLE-TRIMETHOPRIM 800-160 MG PO TABS: 1.0000 | ORAL_TABLET | Freq: Two times a day (BID) | ORAL | 0 refills | Status: DC

## 2019-11-09 NOTE — Patient Instructions (Signed)

## 2019-11-09 NOTE — Progress Notes (Signed)
Patient ID: Nancy Novak, female   DOB: 07-01-1991, 29 y.o.   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  PCP is Dr Woody Seller.   Current Medications, Allergies, Past Medical History, Past Surgical History, Family History and Social History were reviewed in [employer] record.     Review of Systems: Patient denies any headaches, hearing loss, fatigue, blurred vision, shortness of breath, chest pain, abdominal pain, problems with bowel movements(has constipation for weeks and then diarrhea), urination, or intercourse. No joint pain or mood swings. +Weight gain Has sore spot inner left labia, will not pop, has been there a few months, sore with sex   Physical Exam:BP 114/79 (BP Location: Left Arm, Patient Position: Sitting, Cuff Size: Normal)   Pulse 85   Ht 5\' 5"  (1.651 m)   Wt 237 lb 8 oz (107.7 kg)   BMI 39.52 kg/m  General:  Well developed, well nourished, no acute distress Skin:  Warm and dry,has numerous moles none of concern Has several tattoos Neck:  Midline trachea, normal thyroid, good ROM, no lymphadenopathy Lungs; Clear to auscultation bilaterally Breast:  No dominant palpable mass, retraction, or nipple discharge Cardiovascular: Regular rate and rhythm Abdomen:  Soft, non tender, no hepatosplenomegaly Pelvic:  External genitalia is normal in appearance, has 2 mm firm red tender nodule inner crease left inner labia.  The vagina is normal in appearance. Urethra has no lesions or masses. The cervix is bulbous.+IUD strings at os, pap with High risk HPV 16/18 genotyping performed.  Uterus is felt to be normal size, shape, and contour.  No adnexal masses or tenderness noted.Bladder is non tender, no masses felt. Extremities/musculoskeletal:  No swelling or varicosities noted, no clubbing or cyanosis Psych:  No mood changes, alert and  cooperative,seems happy Fall risk is low PHQ 2 score is 0. Examination chaperoned by Estill Bamberg Rash LPN  Impression and Plan: 1. Encounter for gynecological examination with Papanicolaou smear of cervix Pap sent Physical in 1 year Pap in 3 if normal  2. IUD (intrauterine device) in place   3. Folliculitis Will rx septra ds Use warm compress Do not squeeze  Will recheck in 4 weeks  Meds ordered this encounter  Medications  . sulfamethoxazole-trimethoprim (BACTRIM DS) 800-160 MG tablet    Sig: Take 1 tablet by mouth 2 (two) times daily. Take 1 bid    Dispense:  28 tablet    Refill:  0    Order Specific Question:   Supervising Provider    Answer:   EURE, LUTHER H [[id number]]  . phentermine 37.5 MG capsule    Sig: Take 1 capsule (37.5 mg total) by mouth every morning.    Dispense:  30 capsule    Refill:  0    Order Specific Question:   Supervising Provider    Answer:   Elonda Husky, LUTHER H [[id number]]  . linaclotide (LINZESS) 145 MCG CAPS capsule    Sig: Take 1 capsule (145 mcg total) by mouth daily before breakfast.    Dispense:  30 capsule    Refill:  3    Order Specific Question:   Supervising Provider    Answer:   Elonda Husky, LUTHER H [[id number]]    4. Weight loss counseling, encounter for Review handout on weight loss  5. Body mass index 39.0-39.9, adult Will rx adipex Follow up with me in 4 weeks  6. Constipation, unspecified constipation type Will try linzess, if not better will refer to GI

## 2019-11-11 LAB — CYTOLOGY - PAP
Comment: NEGATIVE
Diagnosis: NEGATIVE
High risk HPV: NEGATIVE

## 2019-12-08 ENCOUNTER — Other Ambulatory Visit: Payer: Self-pay

## 2019-12-08 ENCOUNTER — Encounter: Payer: Self-pay | Admitting: Adult Health

## 2019-12-08 ENCOUNTER — Ambulatory Visit (INDEPENDENT_AMBULATORY_CARE_PROVIDER_SITE_OTHER): Payer: PRIVATE HEALTH INSURANCE | Admitting: Adult Health

## 2019-12-08 VITALS — BP 114/78 | HR 82 | Ht 65.0 in | Wt 238.0 lb

## 2019-12-08 DIAGNOSIS — N760 Acute vaginitis: Secondary | ICD-10-CM | POA: Diagnosis not present

## 2019-12-08 DIAGNOSIS — Z975 Presence of (intrauterine) contraceptive device: Secondary | ICD-10-CM | POA: Diagnosis not present

## 2019-12-08 DIAGNOSIS — K5900 Constipation, unspecified: Secondary | ICD-10-CM | POA: Diagnosis not present

## 2019-12-08 DIAGNOSIS — N898 Other specified noninflammatory disorders of vagina: Secondary | ICD-10-CM | POA: Insufficient documentation

## 2019-12-08 DIAGNOSIS — N899 Noninflammatory disorder of vagina, unspecified: Secondary | ICD-10-CM | POA: Diagnosis not present

## 2019-12-08 DIAGNOSIS — B9689 Other specified bacterial agents as the cause of diseases classified elsewhere: Secondary | ICD-10-CM

## 2019-12-08 LAB — POCT WET PREP (WET MOUNT)
Clue Cells Wet Prep Whiff POC: NEGATIVE
WBC, Wet Prep HPF POC: POSITIVE

## 2019-12-08 MED ORDER — LUBIPROSTONE 24 MCG PO CAPS: 24.0000 ug | ORAL_CAPSULE | Freq: Every day | ORAL | 1 refills | Status: DC

## 2019-12-08 MED ORDER — METRONIDAZOLE 500 MG PO TABS: 500.0000 mg | ORAL_TABLET | Freq: Two times a day (BID) | ORAL | 0 refills | Status: DC

## 2019-12-08 NOTE — Progress Notes (Signed)
  Subjective:     Patient ID: Nancy Novak, female   DOB: Apr 22, 1991, 29 y.o.   MRN: [id number]  HPI Nancy Novak is a 29 year old white female,married, [phone] in for follow up on nodule left labia crease and it is still there and tender, and insurance would not cover Linzess, still has constipation and stopped adipex, when had noro virus, did lose some weight, has burning in vaginal area and yellowish discharge in panties. PCP is Nancy Novak.  Review of Systems +yellowish discharge Has had burning Still has nodule left labial crease +constipation  Reviewed past medical,surgical, social and family history. Reviewed medications and allergies.     Objective:   Physical Exam BP 114/78 (BP Location: Left Arm, Patient Position: Sitting, Cuff Size: Normal)   Pulse 82   Ht 5\' 5"  (1.651 m)   Wt 238 lb (108 kg)   BMI 39.61 kg/m  Skin warm and dry.Pelvic: external genitalia is normal in appearance, nodule in crease of left labia seems to be getting bigger about 4 mm-5 mm now, and still firm and tender, ?vein beside it, vagina: white discharge without odor,urethra has no lesions or masses noted, cervix:smooth and bulbous,+IUD strings, uterus: normal size, shape and contour, non tender, no masses felt, adnexa: no masses or tenderness noted. Bladder is non tender and no masses felt. Wet prep: + for clue cells and +WBCs. Examination chaperoned by Estill Bamberg Rash LPN     Assessment:     1. Vaginal discharge  2. BV (bacterial vaginosis) Will rx flagyl Meds ordered this encounter  Medications  . lubiprostone (AMITIZA) 24 MCG capsule    Sig: Take 1 capsule (24 mcg total) by mouth daily with breakfast.    Dispense:  30 capsule    Refill:  1    Order Specific Question:   Supervising Provider    Answer:   Elonda Husky, LUTHER H [[id number]]  . metroNIDAZOLE (FLAGYL) 500 MG tablet    Sig: Take 1 tablet (500 mg total) by mouth 2 (two) times daily.    Dispense:  14 tablet    Refill:  0    Order Specific Question:    Supervising Provider    Answer:   Elonda Husky, LUTHER H [[id number]]    3. IUD (intrauterine device) in place  4. Constipation, unspecified constipation type Will rx amitiza, as Linzess not covered by insurance  5. Nodule of vagina Return 4/27 to see Nancy Elonda Husky about nodule removal     Plan:    Can try adipex again, has some at home still  Return in 3 weeks to see Nancy Elonda Husky

## 2019-12-09 ENCOUNTER — Other Ambulatory Visit: Payer: Self-pay | Admitting: Adult Health

## 2019-12-09 MED ORDER — LINACLOTIDE 145 MCG PO CAPS: 145.0000 ug | ORAL_CAPSULE | Freq: Every day | ORAL | 3 refills | Status: DC

## 2019-12-09 NOTE — Progress Notes (Signed)
Try linzess again insurance says preferred

## 2019-12-16 ENCOUNTER — Encounter: Payer: Self-pay | Admitting: *Deleted

## 2019-12-29 ENCOUNTER — Other Ambulatory Visit: Payer: Self-pay | Admitting: Obstetrics & Gynecology

## 2019-12-29 ENCOUNTER — Encounter: Payer: Self-pay | Admitting: Obstetrics & Gynecology

## 2019-12-29 ENCOUNTER — Other Ambulatory Visit: Payer: Self-pay

## 2019-12-29 ENCOUNTER — Ambulatory Visit (INDEPENDENT_AMBULATORY_CARE_PROVIDER_SITE_OTHER): Payer: PRIVATE HEALTH INSURANCE | Admitting: Obstetrics & Gynecology

## 2019-12-29 VITALS — BP 125/76 | HR 90 | Ht 65.0 in | Wt 235.0 lb

## 2019-12-29 DIAGNOSIS — D280 Benign neoplasm of vulva: Secondary | ICD-10-CM

## 2019-12-29 DIAGNOSIS — L723 Sebaceous cyst: Secondary | ICD-10-CM

## 2019-12-29 NOTE — Progress Notes (Signed)
Patient ID: Nancy Novak, female   DOB: 16-Jan-1991, 29 y.o.   MRN: [id number] Sebaceous Cyst Excision Procedure Note  Pre-operative Diagnosis: epidermal inclusion cyst of left labia minora  Post-operative Diagnosis: same  Locations: left labia minora  Indications: tender painful left vulvar epidermal inclusion cyst  Anesthesia: Lidocaine 1% without epinephrine without added sodium bicarbonate  Procedure Details  History of allergy to iodine: no  Patient informed of the risks (including bleeding and infection) and benefits of the  procedure and Written informed consent obtained.  The lesion and surrounding area was given a sterile prep using betadyne and draped in the usual sterile fashion. An incision was made over the cyst, which was dissected free of the surrounding tissue and removed.  The cyst was filled with typical sebaceous material.  The wound was closed with 3-0 ethilon using simple interrupted stitches. Antibiotic ointment and a sterile dressing applied.  The specimen was sent for pathologic examination. The patient tolerated the procedure well.  EBL: 10 ml  Findings: As above  Condition: Stable  Complications: none.  Plan: 1. Instructed to keep the wound dry and covered for 24-48h and clean thereafter. 2. Warning signs of infection were reviewed.   3. Recommended that the patient use OTC analgesics as needed for pain.  4. Return for suture removal in 9 days. Compression Topical neosporin

## 2019-12-29 NOTE — Addendum Note (Signed)
Addended by: Linton Rump on: 12/29/2019 11:25 AM   Modules accepted: Orders

## 2020-01-07 ENCOUNTER — Other Ambulatory Visit: Payer: Self-pay

## 2020-01-07 ENCOUNTER — Encounter: Payer: Self-pay | Admitting: Obstetrics & Gynecology

## 2020-01-07 ENCOUNTER — Ambulatory Visit (INDEPENDENT_AMBULATORY_CARE_PROVIDER_SITE_OTHER): Payer: PRIVATE HEALTH INSURANCE | Admitting: Obstetrics & Gynecology

## 2020-01-07 VITALS — BP 128/82 | HR 101 | Ht 65.0 in | Wt 235.5 lb

## 2020-01-07 DIAGNOSIS — Z9889 Other specified postprocedural states: Secondary | ICD-10-CM

## 2020-01-07 NOTE — Progress Notes (Signed)
Subjective:    Nancy Novak is a 29 y.o. female s/p removal of left vulvar lesion 12/29/19    ago, which required closure with 4 sutures. . She denies pain, redness, or drainage from the wound.   Review of Systems     Objective:    BP 128/82 (BP Location: Left Arm, Patient Position: Sitting, Cuff Size: Large)   Pulse (!) 101   Ht 5\' 5"  (1.651 m)   Wt 235 lb 8 oz (106.8 kg)   BMI 39.19 kg/m  Injury exam:   is healing well,  evidence of infection.hing well no evidence of infection   Assessment:   Normal post op exam/state  Plan:     1. 4 sutures were removed. 2. Wound care discussed. 3. Follow up as needed.

## 2020-10-28 ENCOUNTER — Other Ambulatory Visit: Payer: PRIVATE HEALTH INSURANCE | Admitting: Advanced Practice Midwife

## 2020-11-15 ENCOUNTER — Other Ambulatory Visit: Payer: PRIVATE HEALTH INSURANCE | Admitting: Women's Health

## 2021-04-24 ENCOUNTER — Encounter: Payer: Self-pay | Admitting: Adult Health

## 2021-04-24 ENCOUNTER — Other Ambulatory Visit: Payer: Self-pay

## 2021-04-24 ENCOUNTER — Ambulatory Visit (INDEPENDENT_AMBULATORY_CARE_PROVIDER_SITE_OTHER): Payer: Managed Care, Other (non HMO) | Admitting: Adult Health

## 2021-04-24 VITALS — BP 119/81 | HR 86 | Ht 65.0 in | Wt 245.4 lb

## 2021-04-24 DIAGNOSIS — Z01419 Encounter for gynecological examination (general) (routine) without abnormal findings: Secondary | ICD-10-CM

## 2021-04-24 DIAGNOSIS — F32A Depression, unspecified: Secondary | ICD-10-CM | POA: Diagnosis not present

## 2021-04-24 DIAGNOSIS — F419 Anxiety disorder, unspecified: Secondary | ICD-10-CM

## 2021-04-24 DIAGNOSIS — Z975 Presence of (intrauterine) contraceptive device: Secondary | ICD-10-CM | POA: Diagnosis not present

## 2021-04-24 MED ORDER — HYDROXYZINE HCL 10 MG PO TABS: 10.0000 mg | ORAL_TABLET | Freq: Three times a day (TID) | ORAL | 1 refills | Status: DC | PRN

## 2021-04-24 MED ORDER — BUSPIRONE HCL 5 MG PO TABS: 5.0000 mg | ORAL_TABLET | Freq: Three times a day (TID) | ORAL | 1 refills | Status: DC

## 2021-04-24 NOTE — Progress Notes (Signed)
0Patient ID: Nancy Novak, female   DOB: September 07, 1990, 30 y.o.   MRN: [id number] History of Present Illness: Nancy Novak is a 30 year old white female,separated getting divorced, [id number], in for a well woman gyn exam. Has IUD and spotting some. +anxiety. Took lexapro in the past and did have suicidal thoughts.   Lab Results  Component Value Date   DIAGPAP  11/09/2019    - Negative for intraepithelial lesion or malignancy (NILM)   [place] Negative 11/09/2019   PCP is Dr Woody Seller  Current Medications, Allergies, Past Medical History, Past Surgical History, Family History and Social History were reviewed in [facility] record.     Review of Systems: Patient denies any headaches, hearing loss, fatigue, blurred vision, shortness of breath, chest pain, abdominal pain, problems with bowel movements, urination, or intercourse. No joint pain or mood swings.  See HPI for positives.   Physical Exam:BP 119/81 (BP Location: Left Arm, Patient Position: Sitting, Cuff Size: Large)   Pulse 86   Ht '5\' 5"'$  (1.651 m)   Wt 245 lb 6.4 oz (111.3 kg)   LMP  (LMP Unknown) Comment: increased spotting lately  BMI 40.84 kg/m   General:  Well developed, well nourished, no acute distress Skin:  Warm and dry Neck:  Midline trachea, normal thyroid, good ROM, no lymphadenopathy Lungs; Clear to auscultation bilaterally Breast:  No dominant palpable mass, retraction, or nipple discharge Cardiovascular: Regular rate and rhythm Abdomen:  Soft, non tender, no hepatosplenomegaly Pelvic:  External genitalia is normal in appearance, no lesions.  The vagina is normal in appearance. Urethra has no lesions or masses. The cervix is bulbous.+short IUD strings at os.  Uterus is felt to be normal size, shape, and contour.  No adnexal masses or tenderness noted.Bladder is non tender, no masses felt. Extremities/musculoskeletal:  No swelling or varicosities noted, no clubbing or cyanosis Psych:  No mood changes, alert  and cooperative,seems happy AA is 0 Fall risk is low Depression screen [facility] 2/9 04/24/2021 11/09/2019 10/02/2016  Decreased Interest 2 0 0  Down, Depressed, Hopeless 2 0 0  PHQ - 2 Score 4 0 0  Altered sleeping 3 - -  Tired, decreased energy 3 - -  Change in appetite 2 - -  Feeling bad or failure about yourself  1 - -  Trouble concentrating 3 - -  Moving slowly or fidgety/restless 3 - -  Suicidal thoughts 0 - -  PHQ-9 Score 19 - -   Denies any SI or HI, GAD 7 : Generalized Anxiety Score 04/24/2021  Nervous, Anxious, on Edge 3  Control/stop worrying 3  Worry too much - different things 3  Trouble relaxing 3  Restless 3  Easily annoyed or irritable 3  Afraid - awful might happen 3  Total GAD 7 Score 21      Upstream - 04/24/21 1545       Pregnancy Intention Screening   Does the patient want to become pregnant in the next year? No    Does the patient's partner want to become pregnant in the next year? No    Would the patient like to discuss contraceptive options today? No      Contraception Wrap Up   Current Method IUD or IUS    End Method IUD or IUS    Contraception Counseling Provided No            Examination chaperoned by Celene Squibb LPN   Impression and Plan: 1. Encounter for well woman exam  with routine gynecological exam Physical in 1 year Pap in 2024  2. Anxiety and depression Will try buspar and vistaril Meds ordered this encounter  Medications   busPIRone (BUSPAR) 5 MG tablet    Sig: Take 1 tablet (5 mg total) by mouth 3 (three) times daily.    Dispense:  90 tablet    Refill:  1    Order Specific Question:   Supervising Provider    Answer:   Elonda Husky, LUTHER H [[id number]]   hydrOXYzine (ATARAX/VISTARIL) 10 MG tablet    Sig: Take 1 tablet (10 mg total) by mouth 3 (three) times daily as needed.    Dispense:  30 tablet    Refill:  1    Order Specific Question:   Supervising Provider    Answer:   Tania Ade H [[id number]]   Follow up in 7 weeks or sooner if  needed   3. IUD (intrauterine device) in place Placed 09/16/17

## 2021-05-16 ENCOUNTER — Other Ambulatory Visit: Payer: Self-pay | Admitting: Adult Health

## 2021-06-12 ENCOUNTER — Encounter: Payer: Self-pay | Admitting: Adult Health

## 2021-06-12 ENCOUNTER — Ambulatory Visit: Payer: Managed Care, Other (non HMO) | Admitting: Adult Health

## 2021-06-12 ENCOUNTER — Other Ambulatory Visit: Payer: Self-pay

## 2021-06-12 ENCOUNTER — Other Ambulatory Visit (HOSPITAL_COMMUNITY)
Admission: RE | Admit: 2021-06-12 | Discharge: 2021-06-12 | Disposition: A | Discharge status: Home / Self Care | Payer: Managed Care, Other (non HMO) | Source: Ambulatory Visit | Attending: Adult Health | Admitting: Adult Health

## 2021-06-12 VITALS — BP 114/73 | Ht 65.0 in | Wt 244.0 lb

## 2021-06-12 DIAGNOSIS — N930 Postcoital and contact bleeding: Secondary | ICD-10-CM

## 2021-06-12 DIAGNOSIS — F419 Anxiety disorder, unspecified: Secondary | ICD-10-CM | POA: Diagnosis not present

## 2021-06-12 DIAGNOSIS — Z113 Encounter for screening for infections with a predominantly sexual mode of transmission: Secondary | ICD-10-CM | POA: Diagnosis not present

## 2021-06-12 DIAGNOSIS — F32A Depression, unspecified: Secondary | ICD-10-CM

## 2021-06-12 DIAGNOSIS — Z975 Presence of (intrauterine) contraceptive device: Secondary | ICD-10-CM

## 2021-06-12 MED ORDER — SERTRALINE HCL 50 MG PO TABS: 50.0000 mg | ORAL_TABLET | Freq: Every day | ORAL | 3 refills | Status: DC

## 2021-06-12 MED ORDER — BUSPIRONE HCL 7.5 MG PO TABS: 7.5000 mg | ORAL_TABLET | Freq: Three times a day (TID) | ORAL | 3 refills | Status: DC

## 2021-06-12 NOTE — Progress Notes (Signed)
Subjective:     Patient ID: Nancy Novak, female   DOB: 24-Aug-1991, 30 y.o.   MRN: [id number]  HPI Remedy is a 30 year old white female,separated, [id number] in for follow up in starting buspar and vistaril, and had hives with vistaril. She wants to be checked for BV, has new sex partner too, and spots after sex, has IUD. PCP is Dr Woody Seller. Lab Results  Component Value Date   DIAGPAP  11/09/2019    - Negative for intraepithelial lesion or malignancy (NILM)   [place] Negative 11/09/2019    Review of Systems Spotting after sex +anxiety Occasional pain with sex Reviewed past medical,surgical, social and family history. Reviewed medications and allergies.     Objective:   Physical Exam BP 114/73 (BP Location: Left Arm, Patient Position: Sitting, Cuff Size: Large)   Ht 5\' 5"  (1.651 m)   Wt 244 lb (110.7 kg)   LMP  (LMP Unknown)   BMI 40.60 kg/m  Skin warm and dry. Lungs: clear to ausculation bilaterally. Cardiovascular: regular rate and rhythm.    Pelvic: external genitalia is normal in appearance no lesions, vagina: scant discharge without odor,urethra has no lesions or masses noted, cervix:smooth and bulbous, +IUD strings, CV swab obtained,uterus: normal size, shape and contour, non tender, no masses felt, adnexa: no masses,LLQ tenderness noted. Bladder is non tender and no masses felt. Fall risk is low Depression screen [facility] 2/9 06/12/2021 04/24/2021 11/09/2019  Decreased Interest 2 2 0  Down, Depressed, Hopeless 2 2 0  PHQ - 2 Score 4 4 0  Altered sleeping 2 3 -  Tired, decreased energy 2 3 -  Change in appetite 2 2 -  Feeling bad or failure about yourself  2 1 -  Trouble concentrating 2 3 -  Moving slowly or fidgety/restless 2 3 -  Suicidal thoughts 0 0 -  PHQ-9 Score 16 19 -  Difficult doing work/chores Very difficult - -    GAD 7 : Generalized Anxiety Score 06/12/2021 04/24/2021  Nervous, Anxious, on Edge 3 3  Control/stop worrying 3 3  Worry too much - different things 3 3   Trouble relaxing 3 3  Restless 2 3  Easily annoyed or irritable 3 3  Afraid - awful might happen 2 3  Total GAD 7 Score 19 21  Anxiety Difficulty Very difficult -      Upstream - 06/12/21 1622       Pregnancy Intention Screening   Does the patient want to become pregnant in the next year? No    Does the patient's partner want to become pregnant in the next year? No    Would the patient like to discuss contraceptive options today? No      Contraception Wrap Up   Current Method IUD or IUS    End Method IUD or IUS    Contraception Counseling Provided No              Examination chaperoned by Glenard Haring RN  Assessment:     1. Anxiety and depression Will increase buspar to 7.5 mg tid and add zoloft 50 mg and discont. Vistaril  Meds ordered this encounter  Medications   sertraline (ZOLOFT) 50 MG tablet    Sig: Take 1 tablet (50 mg total) by mouth daily.    Dispense:  30 tablet    Refill:  3    Order Specific Question:   Supervising Provider    Answer:   Elonda Husky, LUTHER H [[id number]]   busPIRone (BUSPAR)  7.5 MG tablet    Sig: Take 1 tablet (7.5 mg total) by mouth 3 (three) times daily.    Dispense:  90 tablet    Refill:  3    Order Specific Question:   Supervising Provider    Answer:   Florian Buff [[id number]]   Has counseling to be set up by PCP  2. Postcoital bleeding CV swab sent for GC/CHL,trich,BV and yeast Change positions  3. IUD (intrauterine device) in place   4. Screening examination for STD (sexually transmitted disease) CV swab sent     Plan:     Follow up in 8 weeks for ROS

## 2021-06-14 LAB — CERVICOVAGINAL ANCILLARY ONLY
Bacterial Vaginitis (gardnerella): NEGATIVE
Candida Glabrata: NEGATIVE
Candida Vaginitis: NEGATIVE
Chlamydia: NEGATIVE
Comment: NEGATIVE
Comment: NEGATIVE
Comment: NEGATIVE
Comment: NEGATIVE
Comment: NEGATIVE
Comment: NORMAL
Neisseria Gonorrhea: NEGATIVE
Trichomonas: NEGATIVE

## 2021-07-06 ENCOUNTER — Other Ambulatory Visit: Payer: Self-pay | Admitting: Adult Health

## 2021-08-07 ENCOUNTER — Encounter: Payer: Self-pay | Admitting: Adult Health

## 2021-08-07 ENCOUNTER — Ambulatory Visit (INDEPENDENT_AMBULATORY_CARE_PROVIDER_SITE_OTHER): Payer: Managed Care, Other (non HMO) | Admitting: Adult Health

## 2021-08-07 ENCOUNTER — Other Ambulatory Visit: Payer: Self-pay

## 2021-08-07 VITALS — BP 120/81 | HR 101 | Ht 65.0 in | Wt 245.0 lb

## 2021-08-07 DIAGNOSIS — F32A Depression, unspecified: Secondary | ICD-10-CM | POA: Diagnosis not present

## 2021-08-07 DIAGNOSIS — F419 Anxiety disorder, unspecified: Secondary | ICD-10-CM

## 2021-08-07 DIAGNOSIS — R6882 Decreased libido: Secondary | ICD-10-CM | POA: Insufficient documentation

## 2021-08-07 DIAGNOSIS — N941 Unspecified dyspareunia: Secondary | ICD-10-CM | POA: Insufficient documentation

## 2021-08-07 MED ORDER — VENLAFAXINE HCL ER 37.5 MG PO CP24: ORAL_CAPSULE | ORAL | 3 refills | Status: DC

## 2021-08-07 MED ORDER — BUSPIRONE HCL 7.5 MG PO TABS: 7.5000 mg | ORAL_TABLET | Freq: Three times a day (TID) | ORAL | 2 refills | Status: DC

## 2021-08-07 NOTE — Progress Notes (Signed)
Subjective:     Patient ID: Nancy Novak, female   DOB: 19-Jan-1991, 30 y.o.   MRN: [id number]  HPI Nancy Novak is a 30 year old white female, separated, [id number] back in follow up on taking Zoloft and Buspar, she does not remember to take Buspar 3 x daily, and stopped Zoloft, felt sick when took it. She is having decreased libido and vaginal dryness and sex hurts at times  Lab Results  Component Value Date   DIAGPAP  11/09/2019    - Negative for intraepithelial lesion or malignancy (NILM)   [place] Negative 11/09/2019   PCP is Dr Woody Seller  Review of Systems +depressed  + pain with sex at times Vaginal dryness and decreased libido Reviewed past medical,surgical, social and family history. Reviewed medications and allergies.    Objective:   Physical Exam BP 120/81 (BP Location: Right Arm, Patient Position: Sitting, Cuff Size: Large)   Pulse (!) 101   Ht 5\' 5"  (1.651 m)   Wt 245 lb (111.1 kg)   BMI 40.77 kg/m     Skin warm and dry.  Lungs: clear to ausculation bilaterally. Cardiovascular: regular rate and rhythm.  Depression screen [facility] 2/9 08/07/2021 06/12/2021 04/24/2021  Decreased Interest 3 2 2   Down, Depressed, Hopeless 3 2 2   PHQ - 2 Score 6 4 4   Altered sleeping 3 2 3   Tired, decreased energy 3 2 3   Change in appetite 2 2 2   Feeling bad or failure about yourself  2 2 1   Trouble concentrating 3 2 3   Moving slowly or fidgety/restless 2 2 3   Suicidal thoughts 0 0 0  PHQ-9 Score 21 16 19   Difficult doing work/chores - Very difficult -    GAD 7 : Generalized Anxiety Score 08/07/2021 06/12/2021 04/24/2021  Nervous, Anxious, on Edge 2 3 3   Control/stop worrying 2 3 3   Worry too much - different things 3 3 3   Trouble relaxing 2 3 3   Restless 3 2 3   Easily annoyed or irritable 3 3 3   Afraid - awful might happen 1 2 3   Total GAD 7 Score 16 19 21   Anxiety Difficulty - Very difficult -      Upstream - 08/07/21 1500       Pregnancy Intention Screening   Does the patient want to  become pregnant in the next year? No    Does the patient's partner want to become pregnant in the next year? No    Would the patient like to discuss contraceptive options today? No      Contraception Wrap Up   Current Method IUD or IUS    End Method IUD or IUS    Contraception Counseling Provided No             Assessment:     1. Anxiety and depression Will try Effexor and continue Buspar, set alarm on phone Meds ordered this encounter  Medications   venlafaxine XR (EFFEXOR XR) 37.5 MG 24 hr capsule    Sig: Take 1 daily for 1 week then 2 daily    Dispense:  60 capsule    Refill:  3    Order Specific Question:   Supervising Provider    Answer:   Elonda Husky, LUTHER H [[id number]]   busPIRone (BUSPAR) 7.5 MG tablet    Sig: Take 1 tablet (7.5 mg total) by mouth 3 (three) times daily.    Dispense:  270 tablet    Refill:  2    Order Specific Question:  Supervising Provider    Answer:   Florian Buff [[id number]]    Referred to [facility]  2. Dyspareunia in female Try astroglide,   3. Decreased libido Increase frequency of sex  Plan:     Follow up in 6 weeks

## 2021-08-29 ENCOUNTER — Other Ambulatory Visit: Payer: Self-pay | Admitting: Adult Health

## 2021-08-31 ENCOUNTER — Other Ambulatory Visit: Payer: Self-pay | Admitting: Adult Health

## 2021-08-31 DIAGNOSIS — N939 Abnormal uterine and vaginal bleeding, unspecified: Secondary | ICD-10-CM

## 2021-08-31 NOTE — Progress Notes (Signed)
Will get [place] scheduled to assess bleeding,has iUD

## 2021-09-19 ENCOUNTER — Ambulatory Visit: Payer: Managed Care, Other (non HMO) | Admitting: Adult Health

## 2021-10-05 ENCOUNTER — Ambulatory Visit (INDEPENDENT_AMBULATORY_CARE_PROVIDER_SITE_OTHER): Payer: Managed Care, Other (non HMO) | Admitting: Physician Assistant

## 2021-10-05 ENCOUNTER — Other Ambulatory Visit: Payer: Self-pay

## 2021-10-05 VITALS — BP 131/80 | HR 103

## 2021-10-05 DIAGNOSIS — N3000 Acute cystitis without hematuria: Secondary | ICD-10-CM | POA: Diagnosis not present

## 2021-10-05 DIAGNOSIS — R109 Unspecified abdominal pain: Secondary | ICD-10-CM

## 2021-10-05 DIAGNOSIS — R310 Gross hematuria: Secondary | ICD-10-CM | POA: Diagnosis not present

## 2021-10-05 MED ORDER — SULFAMETHOXAZOLE-TRIMETHOPRIM 800-160 MG PO TABS: 1.0000 | ORAL_TABLET | Freq: Two times a day (BID) | ORAL | 0 refills | Status: DC

## 2021-10-05 NOTE — Progress Notes (Signed)
Assessment: 1. Flank pain   2. Gross hematuria   3. Acute cystitis without hematuria     Plan: Resolve Mdx culture ordered, she will stop the doxycycline at this time and begin Bactrim DS twice daily.  Prescription sent to pharmacy.  We will adjust therapy based on culture results.  Medical records and imaging studies discussed at length with the patient and she is reassured that she has no current stone burden.  She is advised that should she pass another stone in the future, she is to collected so we can order an analysis.  She is given stone prevention recommendations and advised to decrease soda and tea intake and increase water intake.  Because she has had gross hematuria will follow-up with CT hematuria study as well as cystoscopic exam.  She has further GYN evaluation pending and will consider Myrbetriq for her incontinence symptoms in the future.  Chief Complaint: No chief complaint on file.   History of Present Illness:  Nancy Novak is a 31 y.o. year old female who is seen in consultation from Jerene Bears B, MD for evaluation of urinary frequency, gross hematuria, UTI, and history of passing urological stones.  Patient also admits to stress type incontinence since the birth of her child in 2017.  No urge symptoms.  1 episode of gross hematuria prior to emergency department evaluation last week.  Patient reports passing several kidney stones in the past couple of years most recently, she passed 2 stones over the past 2 weeks.  Patient was seen in the emergency department on 09/25/2021 with complaint of right flank pain radiating to the right upper abdomen with nausea and dysuria. CT stone study indicated no evidence of stones. No hydro or thickening of bladder wall noted.  Her white count was slightly elevated at 11.3 with no left shift.  Renal function normal and urinalysis unremarkable except a few bacteria.  No microscopic hematuria. Urine culture grew greater than 100,000 colonies  of normal urogenital flora.  Patient was given Toradol, Zofran, IV fluid and discharged with Doxycycline. She had been on Cipro rx from and UC two days prior to ED eval. patient's history is also significant for pelvic pain and irregular vaginal bleeding secondary to IUD.  She has ongoing gynecology appointments and work-up for this.  Patient also sees dermatology for hidradenitis suppurativa and is chronically on doxycycline for this.  PVR 63ml UA-11-30WBC, 0-2RBC, many bacteria, nitrite negative.   Culture 11/22/19 20K colonies mixed flora  11/16/18 multiple organisms, resubmission requested   Medical records reviewed and images personally reviewed   Past Medical History:  Past Medical History:  Diagnosis Date   Asthma     Past Surgical History:  Past Surgical History:  Procedure Laterality Date   CHOLECYSTECTOMY N/A 04/26/2017   Procedure: LAPAROSCOPIC CHOLECYSTECTOMY;  Surgeon: Aviva Signs, MD;  Location: [facility];  Service: General;  Laterality: N/A;   CYST EXCISION     wisdom teeth      Allergies:  Allergies  Allergen Reactions   Hydroxyzine Hives and Rash   Bee Venom Swelling   Other Hives    Bell peppers   Codeine Hives, Nausea Only and Rash    Family History:  Family History  Problem Relation Age of Onset   Hypertension Mother    Hypertension Paternal Grandmother    Cancer Paternal Grandmother        lung   Cancer Paternal Grandfather        lung  Social History:  Social History   Tobacco Use   Smoking status: Never   Smokeless tobacco: Never  Vaping Use   Vaping Use: Never used  Substance Use Topics   Alcohol use: No   Drug use: No    Review of symptoms:  Constitutional:  Negative for unexplained weight loss, night sweats, fever, chills ENT:  Negative for nose bleeds, sinus pain, painful swallowing CV:  Negative for chest pain, shortness of breath, exercise intolerance, palpitations, loss of consciousness Resp:  Negative for cough, wheezing,  shortness of breath GI:  Negative for nausea, vomiting, diarrhea, bloody stools GU:  Positives noted in HPI;  Neuro:  Negative for seizures, poor balance, limb weakness, slurred speech Psych:  Negative for lack of energy Endocrine:  Negative for polydipsia, polyuria, symptoms of hypoglycemia (dizziness, hunger, sweating) Hematologic:  Negative for anemia, purpura, petechia, prolonged or excessive bleeding, use of anticoagulants  Allergic:  Negative for difficulty breathing or choking as a result of exposure to anything; no shellfish allergy; no allergic response (rash/itch) to materials, foods  Physical exam: BP 131/80    Pulse (!) 103  GENERAL APPEARANCE:  Well appearing, well developed, well nourished, NAD HEENT: Atraumatic, Normocephalic NECK: Supple  LUNGS: Clear to auscultation bilaterally. HEART: Regular Rate and Rhythm without murmurs, gallops, or rubs. ABDOMEN: Soft, mild suprapubic tenderness. No Masses. EXTREMITIES: Moves all extremities well.  Without clubbing, cyanosis, or edema. NEUROLOGIC:  Alert and oriented x 3, normal gait, CN II-XII grossly intact.  MENTAL STATUS:  Appropriate. BACK:  Non-tender to palpation.  Mild right-sided CVAT SKIN:  Warm, dry and intact.    Results: No results found for this or any previous visit (from the past 24 hour(s)).

## 2021-10-05 NOTE — Progress Notes (Signed)
[facility] Urine culture sent  Tracking # [id number]  YBNL up number [id number] (Per [employer]- not able to pick up until am of 10/06/2021

## 2021-10-05 NOTE — Progress Notes (Signed)
post void residual=2  Urological Symptom Review  Patient is experiencing the following symptoms: Frequent urination Leakage of urine Blood in urine Urinary tract infection Vaginal bleeding (female only) Kidney stones   Review of Systems  Gastrointestinal (upper)  : Nausea  Gastrointestinal (lower) : Diarrhea Constipation  Constitutional : Night Sweats Fatigue  Skin: Itching  Eyes: Blurred vision  Ear/Nose/Throat : Negative for Ear/Nose/Throat symptoms  Hematologic/Lymphatic: Negative for Hematologic/Lymphatic symptoms  Cardiovascular : Negative for cardiovascular symptoms  Respiratory : Negative for respiratory symptoms  Endocrine: Negative for endocrine symptoms  Musculoskeletal: Back pain  Neurological: Headaches Dizziness  Psychologic: Depression Anxiety

## 2021-10-06 LAB — MICROSCOPIC EXAMINATION: Renal Epithel, UA: NONE SEEN /hpf

## 2021-10-06 LAB — URINALYSIS, ROUTINE W REFLEX MICROSCOPIC
Bilirubin, UA: NEGATIVE
Glucose, UA: NEGATIVE
Ketones, UA: NEGATIVE
Nitrite, UA: NEGATIVE
Protein,UA: NEGATIVE
RBC, UA: NEGATIVE
Specific Gravity, UA: 1.02 (ref 1.005–1.030)
Urobilinogen, Ur: 0.2 mg/dL (ref 0.2–1.0)
pH, UA: 6.5 (ref 5.0–7.5)

## 2021-10-09 ENCOUNTER — Other Ambulatory Visit: Payer: Self-pay | Admitting: Physician Assistant

## 2021-10-16 ENCOUNTER — Encounter: Payer: Self-pay | Admitting: Urology

## 2021-10-16 ENCOUNTER — Other Ambulatory Visit: Payer: Self-pay | Admitting: Physician Assistant

## 2021-10-16 MED ORDER — PHENAZOPYRIDINE HCL 100 MG PO TABS: 100.0000 mg | ORAL_TABLET | Freq: Three times a day (TID) | ORAL | 0 refills | Status: DC | PRN

## 2021-10-16 NOTE — Telephone Encounter (Signed)
Please see patient message. Last urine culture was negative that we sent off to [facility]

## 2021-10-17 ENCOUNTER — Other Ambulatory Visit: Payer: Self-pay

## 2021-10-17 ENCOUNTER — Ambulatory Visit: Payer: Managed Care, Other (non HMO) | Admitting: Physician Assistant

## 2021-10-17 VITALS — BP 114/77 | HR 90 | Wt 245.0 lb

## 2021-10-17 DIAGNOSIS — R310 Gross hematuria: Secondary | ICD-10-CM

## 2021-10-17 DIAGNOSIS — R300 Dysuria: Secondary | ICD-10-CM | POA: Diagnosis not present

## 2021-10-17 LAB — BLADDER SCAN AMB NON-IMAGING: Scan Result: 3

## 2021-10-17 MED ORDER — MIRABEGRON ER 50 MG PO TB24: 50.0000 mg | ORAL_TABLET | Freq: Every day | ORAL | 0 refills | Status: DC

## 2021-10-17 NOTE — Addendum Note (Signed)
Addended by: Tyrone Apple on: 10/17/2021 01:59 PM   Modules accepted: Orders

## 2021-10-17 NOTE — Addendum Note (Signed)
Addended by: Clearence Cheek on: 10/17/2021 01:59 PM   Modules accepted: Orders

## 2021-10-17 NOTE — Progress Notes (Signed)
Assessment: 1. Dysuria   2. Gross hematuria     Plan: Samples of Myrbetriq 50 mg daily #14 given for OAB sxs.  She may can continue either Pyridium or AZO as needed.  We will have her sign medical records for the urgent care where she recently had UA and culture.  Discussed urine findings indicating contamination today.  She will complete the Keflex as recently prescribed.  Await culture results from the urgent care.  CT hematuria study is tomorrow and she follow-up in the next 1 to 2 weeks for cystoscopy to complete gross hematuria evaluation.  Transvaginal ultrasound pending for additional GYN work-up.  Chief Complaint: Persistent dysuria and frequency of urination  HPI: Nancy Novak is a 31 y.o. female who presents for continued evaluation of urinary frequency, burning, and dysuria.  She continues to have post void blood on the tissue when she wipes.  No fever, chills, body aches.  Stress urinary incontinence symptoms are unchanged.  Since her last visit, she has completed a course of Bactrim and is currently on Keflex Rx by UC. Resolve Mdx culture on 10/08/21 was negative. Pt was seen in the ED on 1/23 for the same.  CTU on this day indicated no urinary calcifications or abnormalities that would explain her sxs. Pt has had FU with GYN as well and has complete pelvic US scheduled to further evaluate her abdominal pain and dyspareunia.  Patient maintains her Mirena IUD. She has CT hematuria study and cysto schedule in the coming weeks for eval of gross hematuria.  PVR = 40ml UA = 0-5 WBCs, >10 epis, moderate bacteria, nitrite positive urine  Portions of the above documentation were copied from a prior visit for review purposes only.  Allergies: Allergies  Allergen Reactions   Hydroxyzine Hives and Rash   Bee Venom Swelling   Other Hives    Bell peppers   Codeine Hives, Nausea Only and Rash    PMH: Past Medical History:  Diagnosis Date   Asthma     PSH: Past Surgical  History:  Procedure Laterality Date   CHOLECYSTECTOMY N/A 04/26/2017   Procedure: LAPAROSCOPIC CHOLECYSTECTOMY;  Surgeon: Aviva Signs, MD;  Location: [facility];  Service: General;  Laterality: N/A;   CYST EXCISION     wisdom teeth      SH: Social History   Tobacco Use   Smoking status: Never   Smokeless tobacco: Never  Vaping Use   Vaping Use: Never used  Substance Use Topics   Alcohol use: No   Drug use: No    ROS: Constitutional:  Negative for fever, chills, weight loss CV: Negative for chest pain, previous MI, hypertension Respiratory:  Negative for shortness of breath, wheezing, sleep apnea, frequent cough GI:  Negative for nausea, vomiting, bloody stool, GERD  PE: BP 114/77    Pulse 90    Wt 245 lb (111.1 kg)    BMI 40.77 kg/m  GENERAL APPEARANCE:  Well appearing, well developed, well nourished, NAD HEENT:  Atraumatic, normocephalic NECK:  Supple. Trachea midline ABDOMEN:  Soft, non-tender, no masses EXTREMITIES:  Moves all extremities well, without clubbing, cyanosis, or edema NEUROLOGIC:  Alert and oriented x 3, normal gait, CN II-XII grossly intact MENTAL STATUS:  appropriate SKIN:  Warm, dry, and intact   Results: Laboratory Data: Lab Results  Component Value Date   WBC 14.1 (H) 11/18/2017   HGB 13.9 11/18/2017   HCT 43.9 11/18/2017   MCV 90.5 11/18/2017   PLT 328 11/18/2017  Lab Results  Component Value Date   CREATININE 0.73 11/18/2017     Urinalysis    Component Value Date/Time   COLORURINE YELLOW 11/18/2017 1314   APPEARANCEUR Clear 10/05/2021 1345   LABSPEC 1.028 11/18/2017 1314   PHURINE 5.0 11/18/2017 1314   GLUCOSEU Negative 10/05/2021 1345   HGBUR MODERATE (A) 11/18/2017 1314   BILIRUBINUR Negative 10/05/2021 1345   KETONESUR NEGATIVE 11/18/2017 1314   PROTEINUR Negative 10/05/2021 1345   PROTEINUR NEGATIVE 11/18/2017 1314   UROBILINOGEN 0.2 03/24/2010 2121   NITRITE Negative 10/05/2021 1345   NITRITE NEGATIVE 11/18/2017 1314    LEUKOCYTESUR 1+ (A) 10/05/2021 1345    Lab Results  Component Value Date   LABMICR See below: 10/05/2021   WBCUA 11-30 (A) 10/05/2021   LABEPIT 0-10 10/05/2021   BACTERIA Many (A) 10/05/2021    Pertinent Imaging:  No results found for this or any previous visit. EXAM DATE/TIME: 08/08/2021 9:53 AM .  FINDINGS:  Cholecystectomy. Subtle low-attenuation of the liver near the possible malignant likely represents developing focal fatty infiltration. No concerning abnormality of the liver, spleen, pancreas, adrenal glands, or kidneys. No hydronephrosis. No abnormality of the bladder. There is an IUD which appears to be appropriately positioned.  No acute gastrointestinal abnormalities identified. The appendix is seen in the right lower quadrant and appears unremarkable.  No vascular abnormalities identified. No adenopathy or free fluid.  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  Impression:  IMPRESSION: 1. No acute processes are identified.  Electronically Signed by: Elliot Dally on 08/08/2021 10:18 AM   No results found for this or any previous visit (from the past 24 hour(s)).

## 2021-10-18 ENCOUNTER — Ambulatory Visit (HOSPITAL_COMMUNITY)
Admission: RE | Admit: 2021-10-18 | Discharge: 2021-10-18 | Disposition: A | Discharge status: Home / Self Care | Payer: Managed Care, Other (non HMO) | Source: Ambulatory Visit | Attending: Physician Assistant | Admitting: Physician Assistant

## 2021-10-18 ENCOUNTER — Other Ambulatory Visit: Payer: Self-pay | Admitting: Physician Assistant

## 2021-10-18 DIAGNOSIS — R109 Unspecified abdominal pain: Secondary | ICD-10-CM

## 2021-10-18 DIAGNOSIS — R9389 Abnormal findings on diagnostic imaging of other specified body structures: Secondary | ICD-10-CM

## 2021-10-18 DIAGNOSIS — R310 Gross hematuria: Secondary | ICD-10-CM | POA: Diagnosis not present

## 2021-10-18 LAB — MICROSCOPIC EXAMINATION
Epithelial Cells (non renal): >10 /hpf — AB (ref 0–10)
RBC, Urine: NONE SEEN /hpf (ref 0–2)
Renal Epithel, UA: NONE SEEN /hpf

## 2021-10-18 LAB — URINALYSIS, ROUTINE W REFLEX MICROSCOPIC
Bilirubin, UA: NEGATIVE
Glucose, UA: NEGATIVE
Ketones, UA: NEGATIVE
Nitrite, UA: POSITIVE — AB
Protein,UA: NEGATIVE
RBC, UA: NEGATIVE
Specific Gravity, UA: 1.025 (ref 1.005–1.030)
Urobilinogen, Ur: 0.2 mg/dL (ref 0.2–1.0)
pH, UA: 6 (ref 5.0–7.5)

## 2021-10-18 MED ORDER — IOHEXOL 300 MG/ML  SOLN: 100.0000 mL | Freq: Once | INTRAMUSCULAR | Status: DC | PRN

## 2021-10-18 NOTE — Progress Notes (Signed)
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 Ortho referral for further evaluation. Order placed

## 2021-10-19 LAB — URINE CULTURE: Organism ID, Bacteria: NO GROWTH

## 2021-10-26 ENCOUNTER — Ambulatory Visit: Payer: Managed Care, Other (non HMO)

## 2021-10-26 ENCOUNTER — Ambulatory Visit (INDEPENDENT_AMBULATORY_CARE_PROVIDER_SITE_OTHER): Payer: Managed Care, Other (non HMO)

## 2021-10-26 ENCOUNTER — Encounter: Payer: Self-pay | Admitting: Orthopedic Surgery

## 2021-10-26 ENCOUNTER — Ambulatory Visit: Payer: Managed Care, Other (non HMO) | Admitting: Orthopedic Surgery

## 2021-10-26 ENCOUNTER — Other Ambulatory Visit: Payer: Self-pay

## 2021-10-26 VITALS — BP 131/85 | HR 101 | Ht 65.0 in | Wt 241.0 lb

## 2021-10-26 DIAGNOSIS — M898X8 Other specified disorders of bone, other site: Secondary | ICD-10-CM | POA: Diagnosis not present

## 2021-10-26 DIAGNOSIS — M545 Low back pain, unspecified: Secondary | ICD-10-CM

## 2021-10-26 DIAGNOSIS — N939 Abnormal uterine and vaginal bleeding, unspecified: Secondary | ICD-10-CM

## 2021-10-26 DIAGNOSIS — M4306 Spondylolysis, lumbar region: Secondary | ICD-10-CM

## 2021-10-26 MED ORDER — PREGABALIN 50 MG PO CAPS: 50.0000 mg | ORAL_CAPSULE | Freq: Three times a day (TID) | ORAL | 2 refills | Status: DC

## 2021-10-26 NOTE — Patient Instructions (Signed)
While we are working on your approval for MRI please go ahead and call to schedule your appointment with [facility] within at least one (1) week.   Central Scheduling [phone]  

## 2021-10-26 NOTE — Progress Notes (Signed)
PELVIC US TA/TV: homogeneous anteverted uterus,WNL,EEC 4.9 mm,normal ovaries,IUD is centrally located with in the endometrium,no free fluid,no pain during ultrasound  Chaperone Estill Bamberg
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
## 2021-10-31 ENCOUNTER — Telehealth: Payer: Self-pay | Admitting: Adult Health

## 2021-10-31 NOTE — Telephone Encounter (Signed)
Left message that IUD in place and [place] was normal

## 2021-11-03 ENCOUNTER — Encounter: Payer: Self-pay | Admitting: Urology

## 2021-11-03 ENCOUNTER — Ambulatory Visit (INDEPENDENT_AMBULATORY_CARE_PROVIDER_SITE_OTHER): Payer: Managed Care, Other (non HMO) | Admitting: Urology

## 2021-11-03 ENCOUNTER — Other Ambulatory Visit: Payer: Self-pay

## 2021-11-03 VITALS — BP 133/83 | HR 132

## 2021-11-03 DIAGNOSIS — R310 Gross hematuria: Secondary | ICD-10-CM | POA: Diagnosis not present

## 2021-11-03 LAB — URINALYSIS, ROUTINE W REFLEX MICROSCOPIC
Bilirubin, UA: NEGATIVE
Glucose, UA: NEGATIVE
Ketones, UA: NEGATIVE
Leukocytes,UA: NEGATIVE
Nitrite, UA: NEGATIVE
Protein,UA: NEGATIVE
RBC, UA: NEGATIVE
Specific Gravity, UA: 1.025 (ref 1.005–1.030)
Urobilinogen, Ur: 0.2 mg/dL (ref 0.2–1.0)
pH, UA: 6 (ref 5.0–7.5)

## 2021-11-03 MED ORDER — GEMTESA 75 MG PO TABS: 1.0000 | ORAL_TABLET | Freq: Every day | ORAL | 0 refills | Status: DC

## 2021-11-03 MED ORDER — CIPROFLOXACIN HCL 500 MG PO TABS
500.0000 mg | ORAL_TABLET | Freq: Once | ORAL | Status: AC
  Administered 2021-11-03: 500 mg via ORAL

## 2021-11-03 NOTE — Progress Notes (Signed)
? ?  11/03/21 ? ?CC: hematuria ? ? ?HPI: ?Ms Keeven is a 31yo here for cystoscopy for hematuria and pelvic pain ?Blood pressure 133/83, pulse (!) 132. ?NED. A&Ox3.   ?No respiratory distress   ?Abd soft, NT, ND ?Normal external genitalia with patent urethral meatus ? ?Cystoscopy Procedure Note ? ?Patient identification was confirmed, informed consent was obtained, and patient was prepped using Betadine solution.  Lidocaine jelly was administered per urethral meatus.   ? ?Procedure: ?- Flexible cystoscope introduced, without any difficulty.   ?- Thorough search of the bladder revealed: ?   normal urethral meatus ?   normal urothelium ?   no stones ?   no ulcers  ?   no tumors ?   no urethral polyps ?   no trabeculation ? ?- Ureteral orifices were normal in position and appearance. ? ?Post-Procedure: ?- Patient tolerated the procedure well ? ?Assessment/ Plan: ?-We will trial gemtesa 75mg  daily ? ? Return in about 4 weeks (around 12/01/2021). ? ?Nicolette Bang, MD  ?

## 2021-11-03 NOTE — Patient Instructions (Signed)
Urinary Tract Infection, Adult A urinary tract infection (UTI) is an infection of any part of the urinary tract. The urinary tract includes the kidneys, ureters, bladder, and urethra. These organs make, store, and get rid of urine in the body. An upper UTI affects the ureters and kidneys. A lower UTI affects the bladder and urethra. What are the causes? Most urinary tract infections are caused by bacteria in your genital area around your urethra, where urine leaves your body. These bacteria grow and cause inflammation of your urinary tract. What increases the risk? You are more likely to develop this condition if: You have a urinary catheter that stays in place. You are not able to control when you urinate or have a bowel movement (incontinence). You are female and you: Use a spermicide or diaphragm for birth control. Have low estrogen levels. Are pregnant. You have certain genes that increase your risk. You are sexually active. You take antibiotic medicines. You have a condition that causes your flow of urine to slow down, such as: An enlarged prostate, if you are female. Blockage in your urethra. A kidney stone. A nerve condition that affects your bladder control (neurogenic bladder). Not getting enough to drink, or not urinating often. You have certain medical conditions, such as: Diabetes. A weak disease-fighting system (immunesystem). Sickle cell disease. Gout. Spinal cord injury. What are the signs or symptoms? Symptoms of this condition include: Needing to urinate right away (urgency). Frequent urination. This may include small amounts of urine each time you urinate. Pain or burning with urination. Blood in the urine. Urine that smells bad or unusual. Trouble urinating. Cloudy urine. Vaginal discharge, if you are female. Pain in the abdomen or the lower back. You may also have: Vomiting or a decreased appetite. Confusion. Irritability or tiredness. A fever or  chills. Diarrhea. The first symptom in older adults may be confusion. In some cases, they may not have any symptoms until the infection has worsened. How is this diagnosed? This condition is diagnosed based on your medical history and a physical exam. You may also have other tests, including: Urine tests. Blood tests. Tests for STIs (sexually transmitted infections). If you have had more than one UTI, a cystoscopy or imaging studies may be done to determine the cause of the infections. How is this treated? Treatment for this condition includes: Antibiotic medicine. Over-the-counter medicines to treat discomfort. Drinking enough water to stay hydrated. If you have frequent infections or have other conditions such as a kidney stone, you may need to see a health care provider who specializes in the urinary tract (urologist). In rare cases, urinary tract infections can cause sepsis. Sepsis is a life-threatening condition that occurs when the body responds to an infection. Sepsis is treated in the hospital with IV antibiotics, fluids, and other medicines. Follow these instructions at home: Medicines Take over-the-counter and prescription medicines only as told by your health care provider. If you were prescribed an antibiotic medicine, take it as told by your health care provider. Do not stop using the antibiotic even if you start to feel better. General instructions Make sure you: Empty your bladder often and completely. Do not hold urine for long periods of time. Empty your bladder after sex. Wipe from front to back after urinating or having a bowel movement if you are female. Use each tissue only one time when you wipe. Drink enough fluid to keep your urine pale yellow. Keep all follow-up visits. This is important. Contact a health care provider   if: Your symptoms do not get better after 1-2 days. Your symptoms go away and then return. Get help right away if: You have severe pain in your  back or your lower abdomen. You have a fever or chills. You have nausea or vomiting. Summary A urinary tract infection (UTI) is an infection of any part of the urinary tract, which includes the kidneys, ureters, bladder, and urethra. Most urinary tract infections are caused by bacteria in your genital area. Treatment for this condition often includes antibiotic medicines. If you were prescribed an antibiotic medicine, take it as told by your health care provider. Do not stop using the antibiotic even if you start to feel better. Keep all follow-up visits. This is important. This information is not intended to replace advice given to you by your health care provider. Make sure you discuss any questions you have with your health care provider. Document Revised: 04/01/2020 Document Reviewed: 04/01/2020 Elsevier Patient Education  2022 [employer] Inc[employer]  

## 2021-11-14 ENCOUNTER — Ambulatory Visit (HOSPITAL_COMMUNITY)
Admission: RE | Admit: 2021-11-14 | Discharge: 2021-11-14 | Disposition: A | Discharge status: Home / Self Care | Payer: Managed Care, Other (non HMO) | Source: Ambulatory Visit | Attending: Orthopedic Surgery | Admitting: Orthopedic Surgery

## 2021-11-14 ENCOUNTER — Other Ambulatory Visit: Payer: Self-pay

## 2021-11-14 DIAGNOSIS — M545 Low back pain, unspecified: Secondary | ICD-10-CM

## 2021-11-14 IMAGING — MR MR SACRUM / SI JOINTS WO CM
4 of 5 series · 19 of 48 positions shown · non-contrast
Comparison: None.

CLINICAL DATA: Sacral bone lesion. Lower back pain radiating to
both legs for 8 years.

EXAM:
MRI SACRUM WITHOUT CONTRAST
TECHNIQUE: Multiplanar multi-sequence MR imaging of the sacrum was performed.
No intravenous contrast was administered.

[Series 2: T2 fat-sat · sagittal · 4.0mm · 0.75mm/px · 3 of 25 slices shown]
[im 4/25]
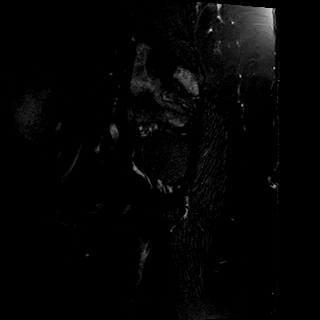
[im 13/25]
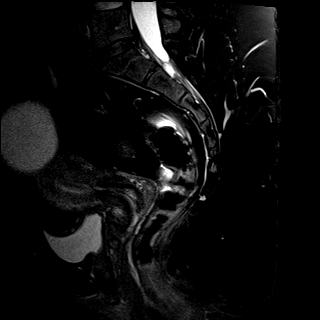
[im 22/25]
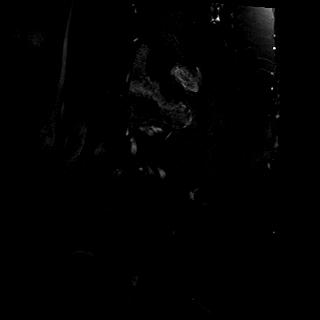

[Series 3: T1 · axial · 4.0mm · 0.55mm/px · z∈[-89,+23]mm · 9 of 25 slices shown (1 of 2)]
[im 1/25]
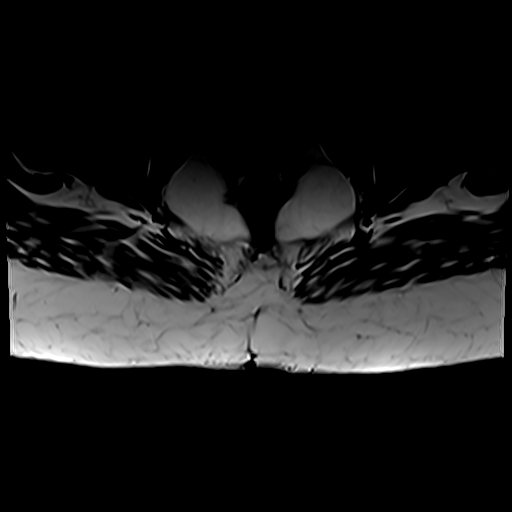
[im 4/25]
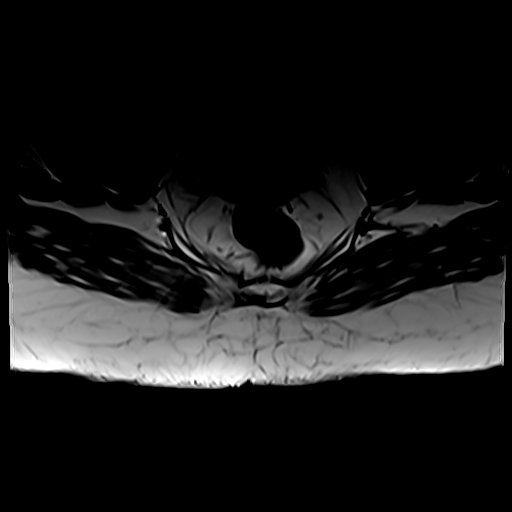
[im 7/25]
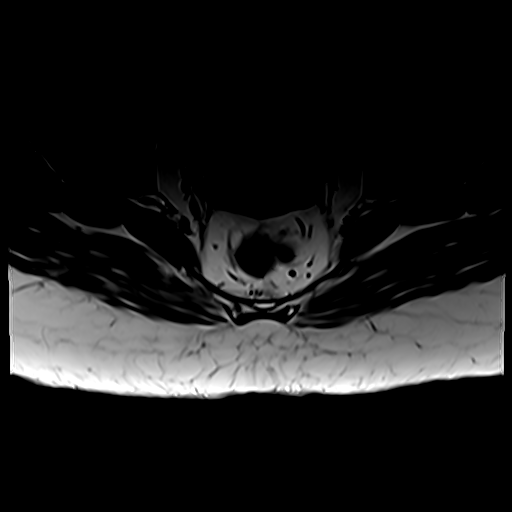
[im 10/25]
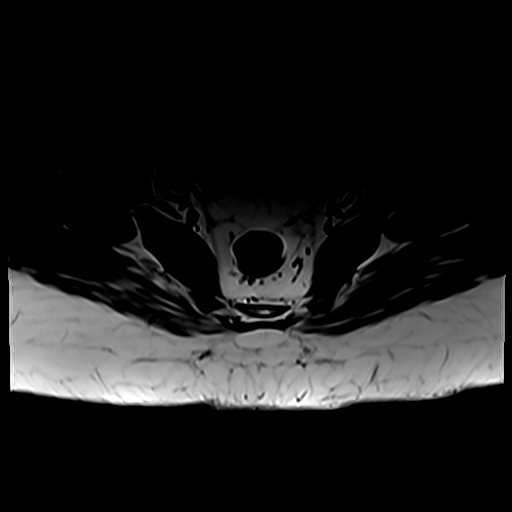
[im 13/25]
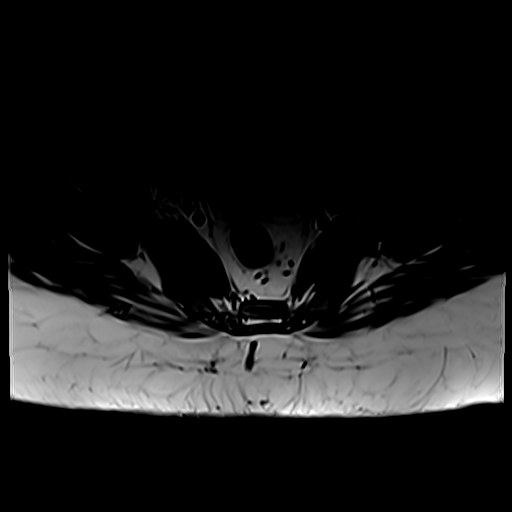
[im 16/25]
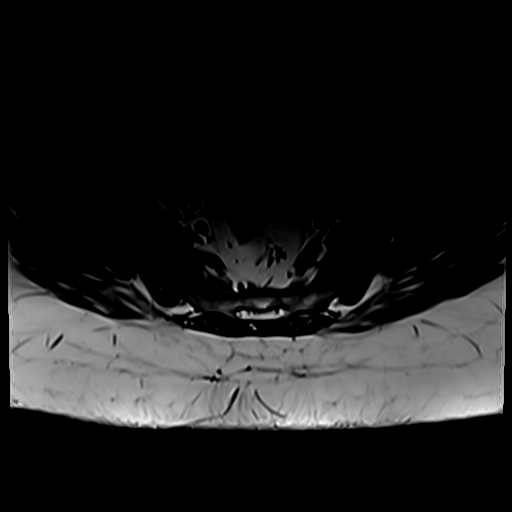
[im 19/25]
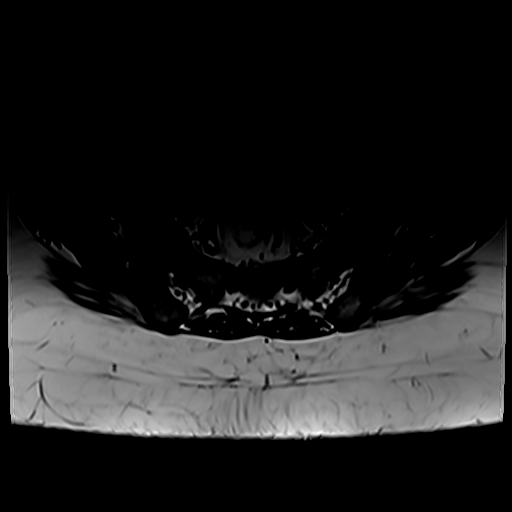
[im 22/25]
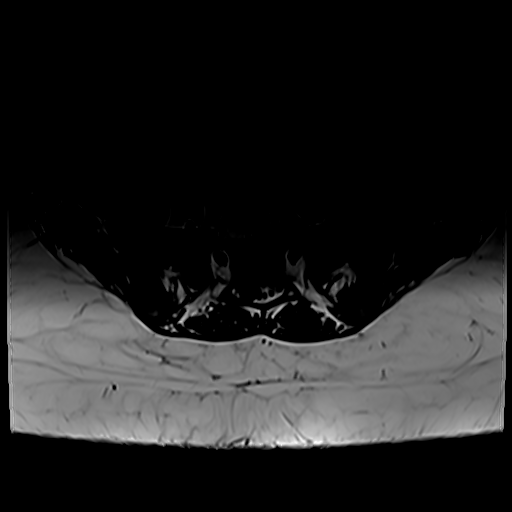
[im 25/25]
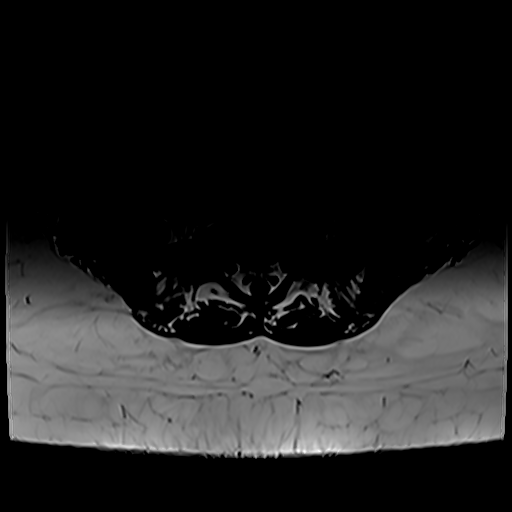

[Series 5: STIR · coronal · 3.0mm · 0.51mm/px · 3 of 25 slices shown]
[im 3/25]
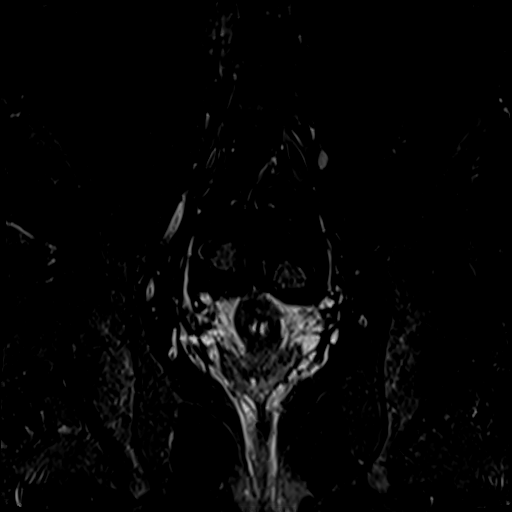
[im 14/25]
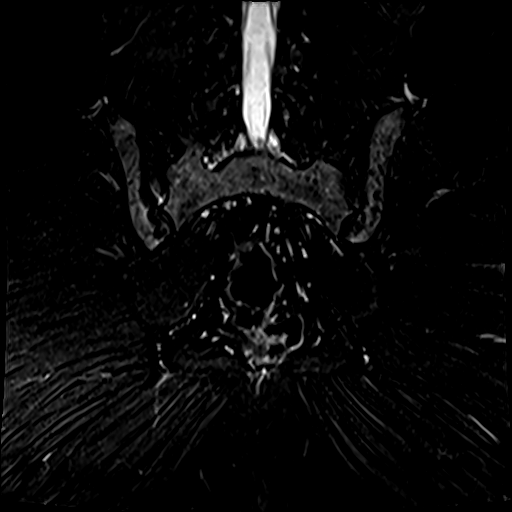
[im 22/25]
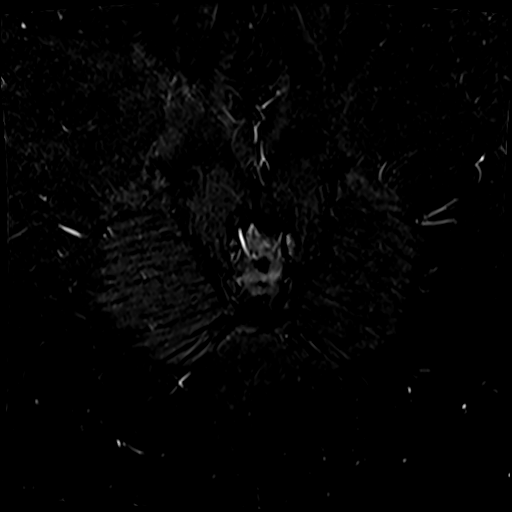

[Series 6: T1 · coronal · 3.0mm · 0.45mm/px · 4 of 25 slices shown (2 of 2)]
[im 1/25]
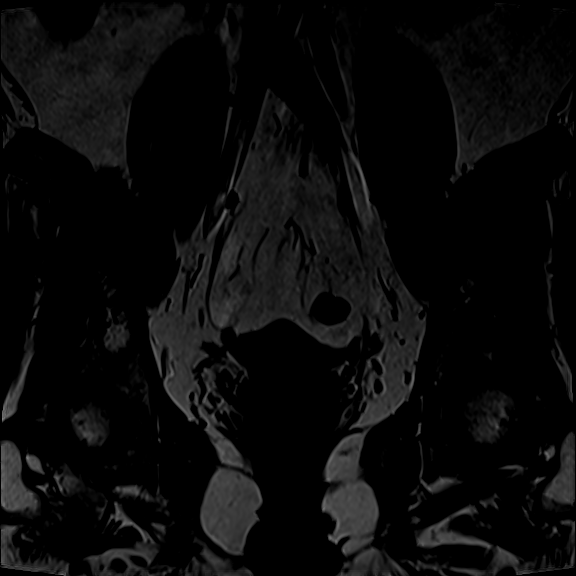
[im 3/25]
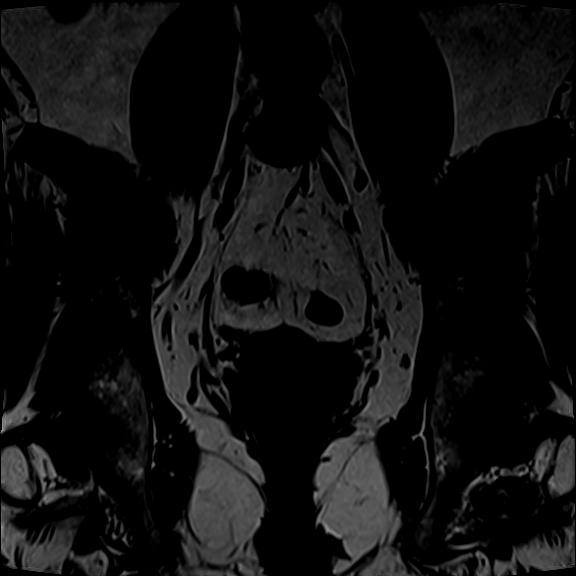
[im 14/25]
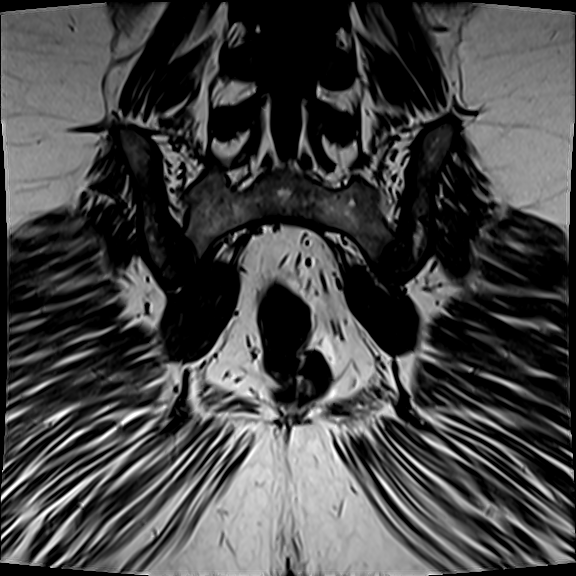
[im 22/25]
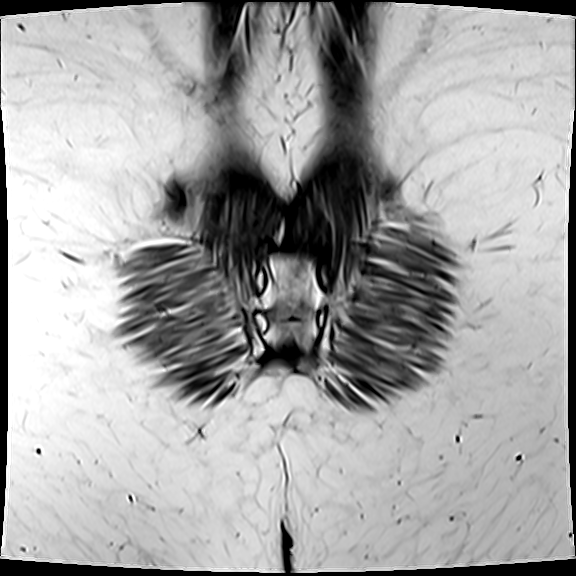

[19 of 48 positions shown; findings below may reference images not displayed]

Lumbar spine radiographs [DATE], CT abdomen and pelvis
[DATE] and [DATE]; abdominal and pelvic radiographs
[DATE]
FINDINGS: Urinary Tract:  No abnormality visualized.

Bowel:  Unremarkable visualized pelvic bowel loops.

Vascular/Lymphatic: No pathologically enlarged lymph nodes. No
significant vascular abnormality seen.

Reproductive: An IUD is visualized within the endometrial canal of
the uterus. Imaged on sagittal images only there is an oval
well-circumscribed intermediate increased T2 signal partially
visualized structure measuring up to 6.0 cm abutting the superior
aspect of the uterine fundus (sagittal series 2, image 11). No
corresponding structure is seen on recent CT abdomen and pelvis
[DATE] or [DATE]. This may represent bowel, however does not
definitely connect to bowel. It may represent an endometrioma
related to the right or left adnexa. This is incompletely evaluated
on the current study.

Other:  None.

Musculoskeletal:

Sacral bone:

On prior CT abdomen and pelvis [DATE], there is a sclerotic bone
lesion within the superior left hemisacrum measuring up to 9 mm.
This appears new compared to remote [DATE] CT. On the current
MRI, this corresponds to a 9 mm decreased T1 and decreased T2 signal
sclerotic focus. There is no surrounding marrow edema or cortical
destruction. Additionally, this appears unchanged from [DATE]
radiographs.

No additional suspicious lesion is visualized within the imaged
sacrum or pelvis. There is a partially visualized benign fat
intensity lesion within the right ilium just superior to the CT of
the right acetabulum.

There is moderate decreased T1 and decreased T2 signal seen on the
iliac greater than sacral sides of the bilateral sacroiliac joints,
corresponding to the sclerosis seen on radiographs. This again is
suggestive of osteitis condensans ilii.

No acute fracture or avascular necrosis is seen within the
visualized pelvis.

Segmentation:  Standard.

Alignment:  Physiologic.
IMPRESSION: No significant change in the small sclerotic focus within the
superior left hemisacrum measuring up to 9 mm. This is compatible
with a benign bone island. It is new compared to remote [DATE]
CT nearly 12 years ago. It is visible on [DATE] radiographs
likely unchanged size suggesting a benign process. Consider
follow-up radiographs in 1 year to assess longer term radiographic
stability.

## 2021-11-14 IMAGING — MR MR LUMBAR SPINE W/O CM
5 series · 32 of 48 positions shown · non-contrast
Comparison: Lumbar spine radiographs [DATE]

CLINICAL DATA: Chronic low back pain radiating to both legs.

EXAM:
MRI LUMBAR SPINE WITHOUT CONTRAST
TECHNIQUE: Multiplanar, multisequence MR imaging of the lumbar spine was
performed. No intravenous contrast was administered.

[Series 5: T2 · sagittal · 4.0mm · 0.68mm/px · 6 of 16 slices shown (1 of 2)]
[im 1/16]
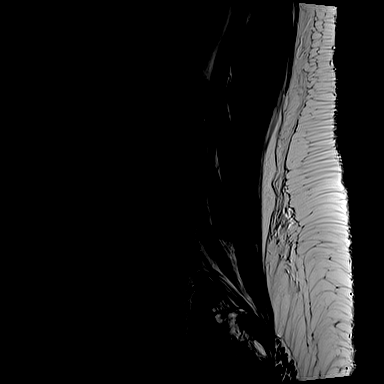
[im 4/16]
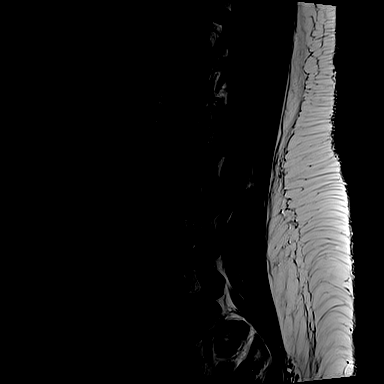
[im 7/16]
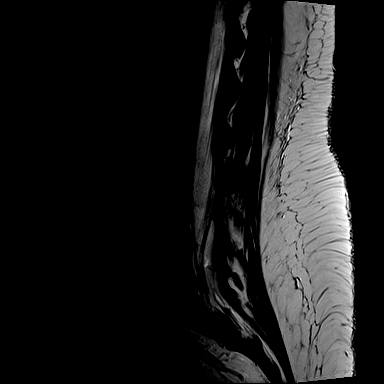
[im 10/16]
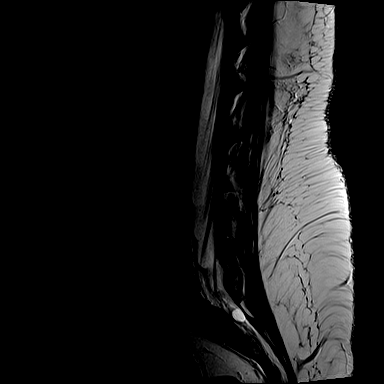
[im 13/16]
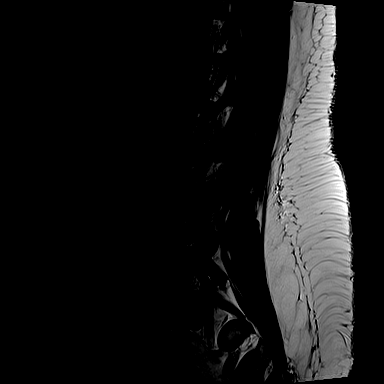
[im 16/16]
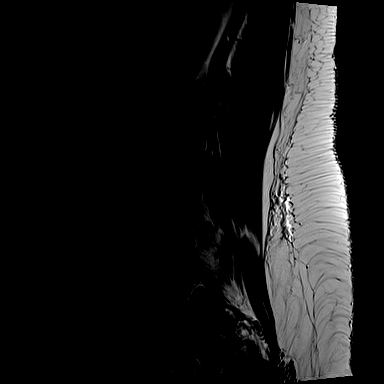

[Series 6: T1 · sagittal · 4.0mm · 0.81mm/px · 7 of 15 slices shown (1 of 2)]
[im 1/15]
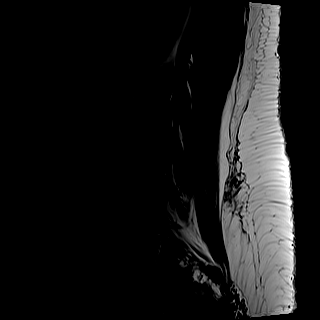
[im 3/15]
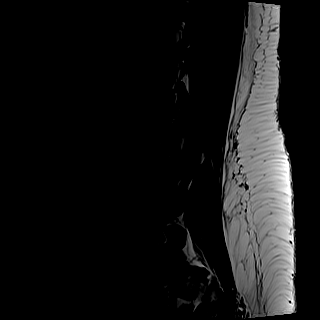
[im 5/15]
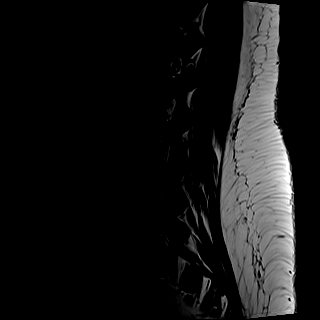
[im 8/15]
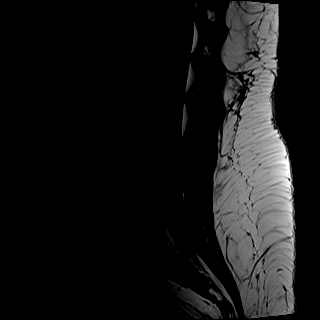
[im 10/15]
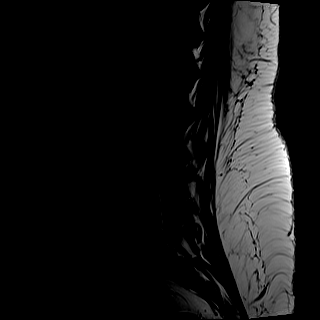
[im 12/15]
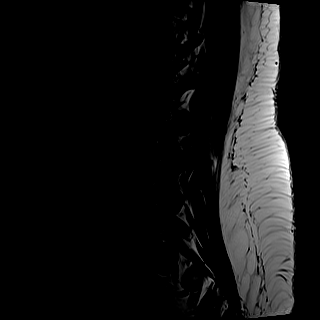
[im 15/15]
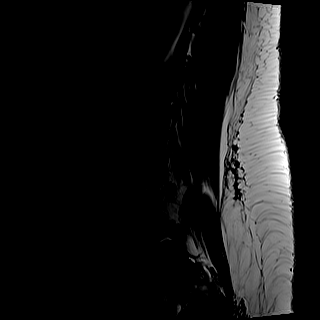

[Series 7: STIR · sagittal · 4.0mm · 0.51mm/px · 3 of 15 slices shown]
[im 1/15]
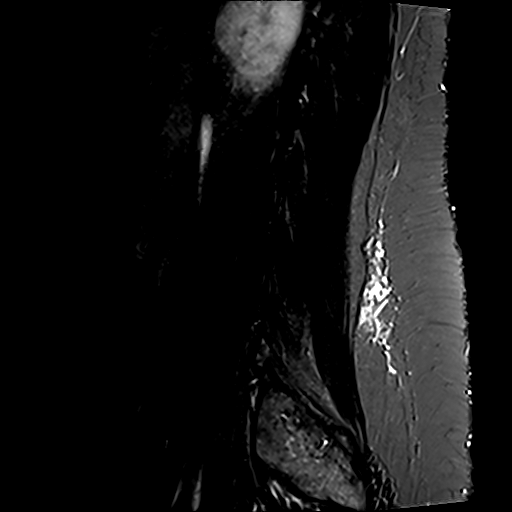
[im 3/15]
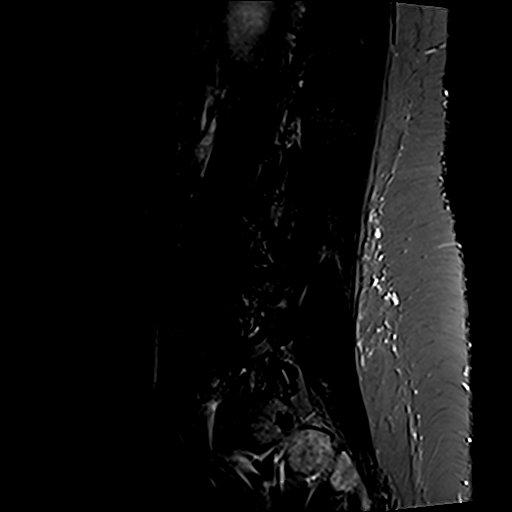
[im 5/15]
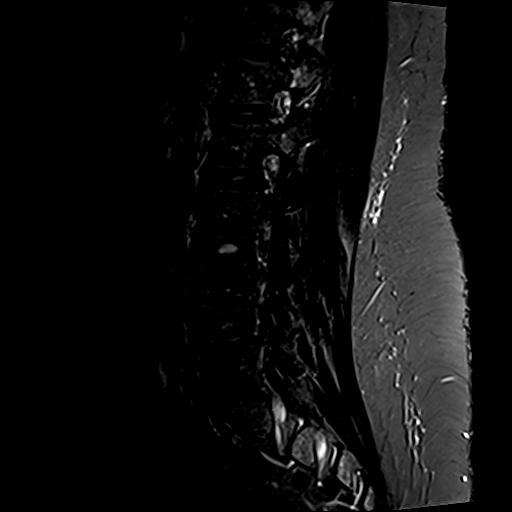

[Series 8: T2 · axial · 4.0mm · 0.70mm/px · z∈[-59,+111]mm · 8 of 31 slices shown (2 of 2)]
[im 1/31]
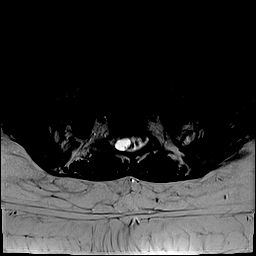
[im 5/31]
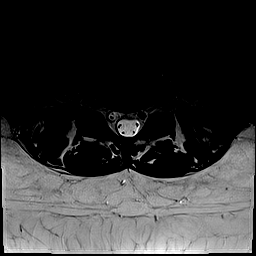
[im 10/31]
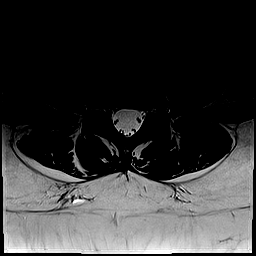
[im 14/31]
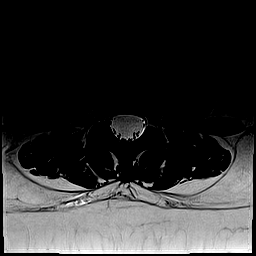
[im 17/31]
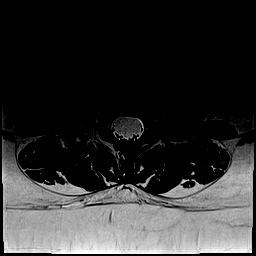
[im 21/31]
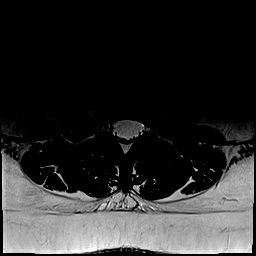
[im 26/31]
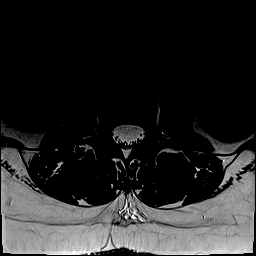
[im 31/31]
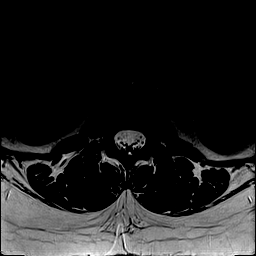

[Series 9: T1 · axial · 4.0mm · 0.35mm/px · z∈[-59,+111]mm · 8 of 31 slices shown (2 of 2)]
[im 1/31]
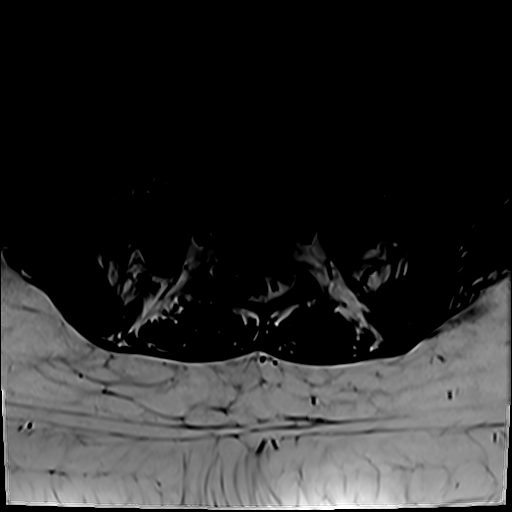
[im 5/31]
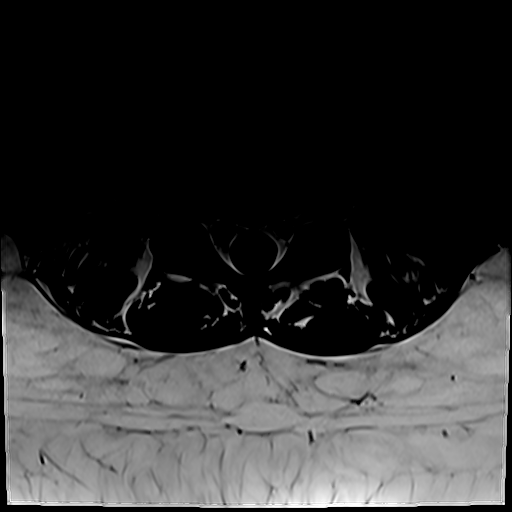
[im 10/31]
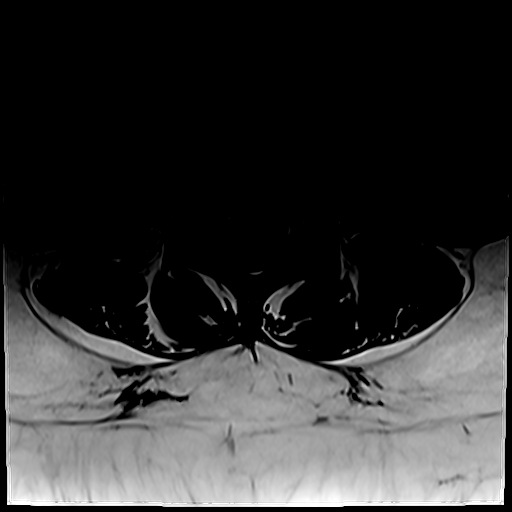
[im 14/31]
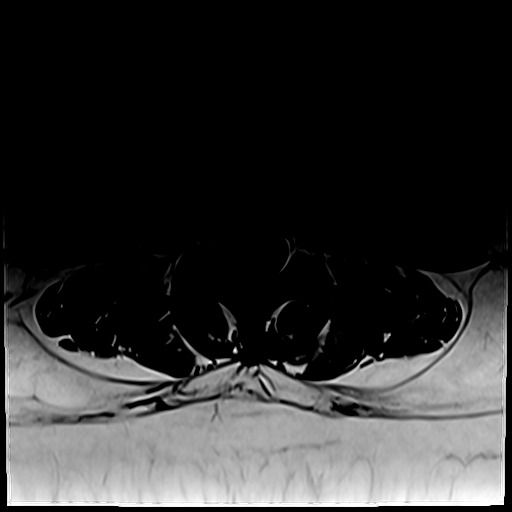
[im 17/31]
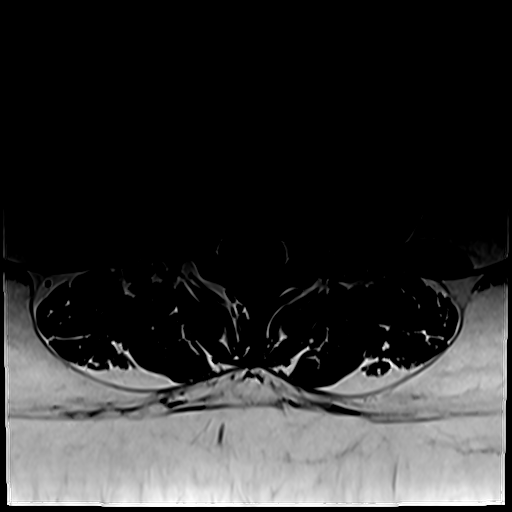
[im 21/31]
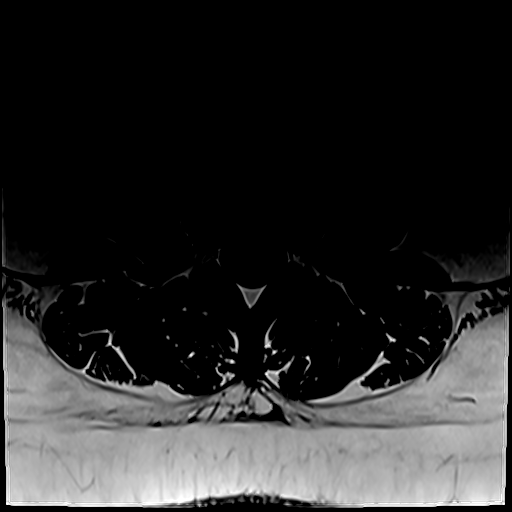
[im 26/31]
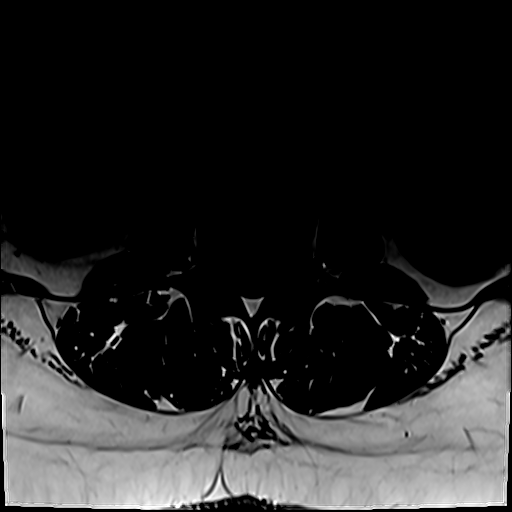
[im 31/31]
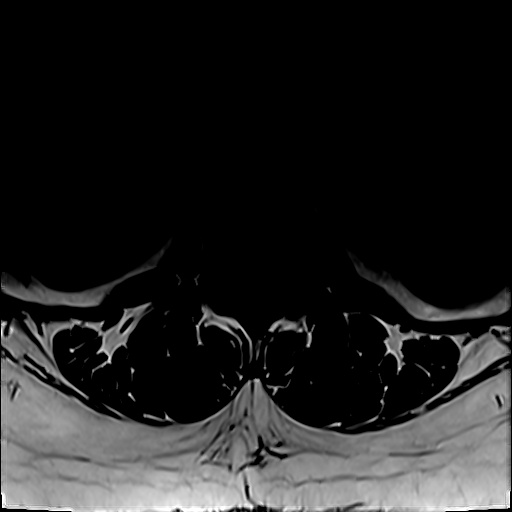

[32 of 48 positions shown; findings below may reference images not displayed]

FINDINGS: Segmentation:  Standard.

Alignment:  Normal.

Vertebrae: No fracture, suspicious marrow lesion, or significant
marrow edema. Modic type 2 endplate changes at L5-S1.

Conus medullaris and cauda equina: Conus extends to the L1 level.
Conus and cauda equina appear normal.

Paraspinal and other soft tissues: Unremarkable.

Disc levels:

Capacious lumbar spinal canal.

L1-2 through L4-5: Negative.

L5-S1: Disc desiccation. Shallow central disc protrusion with
annular fissure. No stenosis.
IMPRESSION: Small central disc protrusion at L5-S1 without stenosis.

## 2021-11-15 ENCOUNTER — Other Ambulatory Visit: Payer: Self-pay

## 2021-11-15 DIAGNOSIS — M898X8 Other specified disorders of bone, other site: Secondary | ICD-10-CM

## 2021-11-15 DIAGNOSIS — M4306 Spondylolysis, lumbar region: Secondary | ICD-10-CM

## 2021-11-15 DIAGNOSIS — M545 Low back pain, unspecified: Secondary | ICD-10-CM

## 2021-11-16 ENCOUNTER — Ambulatory Visit: Payer: Managed Care, Other (non HMO) | Admitting: Obstetrics & Gynecology

## 2021-11-16 ENCOUNTER — Other Ambulatory Visit: Payer: Self-pay

## 2021-11-16 ENCOUNTER — Encounter: Payer: Self-pay | Admitting: Obstetrics & Gynecology

## 2021-11-16 ENCOUNTER — Encounter: Payer: Self-pay | Admitting: Orthopedic Surgery

## 2021-11-16 VITALS — BP 117/82 | HR 89 | Ht 65.0 in | Wt 238.0 lb

## 2021-11-16 DIAGNOSIS — N941 Unspecified dyspareunia: Secondary | ICD-10-CM

## 2021-11-16 DIAGNOSIS — R102 Pelvic and perineal pain: Secondary | ICD-10-CM | POA: Diagnosis not present

## 2021-11-16 MED ORDER — DESOGESTREL-ETHINYL ESTRADIOL 0.15-30 MG-MCG PO TABS: 1.0000 | ORAL_TABLET | Freq: Every day | ORAL | 11 refills | Status: DC

## 2021-11-16 NOTE — Progress Notes (Signed)
? ? ? ? ?Chief Complaint  ?Patient presents with  ? Follow-up  ?  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 ? ? ? ?31 y.o. [id number] No LMP recorded. (Menstrual status: IUD). The current method of family planning is IUD. ? ?Outpatient Encounter Medications as of 11/16/2021  ?Medication Sig  ? desogestrel-ethinyl estradiol (APRI) 0.15-30 MG-MCG tablet Take 1 tablet by mouth daily.  ? busPIRone (BUSPAR) 7.5 MG tablet Take 7.5 mg by mouth 3 (three) times daily.  ? cetirizine (ZYRTEC) 10 MG tablet Take 10 mg by mouth daily.  ? clindamycin (CLEOCIN) 300 MG capsule Take 300 mg by mouth every 6 (six) hours. (Patient not taking: Reported on 11/03/2021)  ? doxycycline (VIBRAMYCIN) 100 MG capsule Take 100 mg by mouth daily. (Patient not taking: Reported on 11/03/2021)  ? EPINEPHrine 0.3 mg/0.3 mL IJ SOAJ injection Inject into the muscle. (Patient not taking: Reported on 11/03/2021)  ? ketorolac (TORADOL) 10 MG tablet Take 10 mg by mouth every 6 (six) hours as needed.  ? Ketotifen Fumarate (ALAWAY OP) Apply to eye.  ? levonorgestrel (MIRENA) 20 MCG/24HR IUD 1 each by Intrauterine route once.  ? mirabegron ER (MYRBETRIQ) 50 MG TB24 tablet Take 1 tablet (50 mg total) by mouth daily.  ? phenazopyridine (PYRIDIUM) 100 MG tablet Take 1 tablet (100 mg total) by mouth 3 (three) times daily as needed for pain.  ? Polyethyl Glycol-Propyl Glycol (SYSTANE ULTRA OP) Apply to eye.  ? predniSONE (DELTASONE) 20 MG tablet Take 20 mg by mouth 2 (two) times daily.  ? pregabalin (LYRICA) 50 MG capsule Take 1 capsule (50 mg total) by mouth 3 (three) times daily.  ? RESTASIS 0.05 % ophthalmic emulsion Place 1 drop into the right eye 2 (two) times daily.  ? spironolactone (ALDACTONE) 100 MG tablet Take 100 mg by mouth daily.  ? Vibegron (GEMTESA) 75 MG TABS Take 1 capsule by mouth daily.  ? vitamin B-12 (CYANOCOBALAMIN) 100 MCG tablet Take 100 mcg by mouth daily.  ? ?No facility-administered encounter medications on file as of 11/16/2021.  ? ? ?Subjective ?Pt with  dyspareunia with insertion and deep thrusting ?Sort of a general pelvic aching, constant, mainly on the right side ?Most pelvic symptoms are on the right side ? ?Was originally referred for a MRI read of possible endometrioma, sonogram and CT do not show that at all, so likely a redundant sigmoid definitiely not gyn finding ? ?Past Medical History:  ?Diagnosis Date  ? Asthma   ? ? ?Past Surgical History:  ?Procedure Laterality Date  ? CHOLECYSTECTOMY N/A 04/26/2017  ? Procedure: LAPAROSCOPIC CHOLECYSTECTOMY;  Surgeon: Aviva Signs, MD;  Location: [facility];  Service: General;  Laterality: N/A;  ? CYST EXCISION    ? wisdom teeth    ? ? ?OB History   ? ? Gravida  ?4  ? Para  ?1  ? Term  ?1  ? Preterm  ?0  ? AB  ?3  ? Living  ?1  ?  ? ? SAB  ?3  ? IAB  ?0  ? Ectopic  ?0  ? Multiple  ?0  ? Live Births  ?1  ?   ?  ? Obstetric Comments  ?Had an early pregnancy loss last summer  ?  ? ?  ? ? ?Allergies  ?Allergen Reactions  ? Hydroxyzine Hives and Rash  ? Bee Venom Swelling  ? Other Hives  ?  Bell peppers  ? Codeine Hives, Nausea Only and Rash  ? ? ?Social History  ? ?  Socioeconomic History  ? Marital status: Legally Separated  ?  Spouse name: Not on file  ? Number of children: Not on file  ? Years of education: Not on file  ? Highest education level: Not on file  ?Occupational History  ? Occupation: l&D nurse at [employer]   ?Tobacco Use  ? Smoking status: Never  ? Smokeless tobacco: Never  ?Vaping Use  ? Vaping Use: Never used  ?Substance and Sexual Activity  ? Alcohol use: No  ? Drug use: No  ? Sexual activity: Yes  ?  Birth control/protection: I.U.D.  ?Other Topics Concern  ? Not on file  ?Social History Narrative  ? Not on file  ? ?Social Determinants of Health  ? ?Financial Resource Strain: Medium Risk  ? Difficulty of Paying Living Expenses: Somewhat hard  ?Food Insecurity: Food Insecurity Present  ? Worried About Charity fundraiser in the Last Year: Often true  ? Ran Out of Food in the Last Year: Often true   ?Transportation Needs: No Transportation Needs  ? Lack of Transportation (Medical): No  ? Lack of Transportation (Non-Medical): No  ?Physical Activity: Sufficiently Active  ? Days of Exercise per Week: 5 days  ? Minutes of Exercise per Session: 60 min  ?Stress: Stress Concern Present  ? Feeling of Stress : Very much  ?Social Connections: Socially Isolated  ? Frequency of Communication with Friends and Family: Three times a week  ? Frequency of Social Gatherings with Friends and Family: Once a week  ? Attends Religious Services: Never  ? Active Member of Clubs or Organizations: No  ? Attends Archivist Meetings: Never  ? Marital Status: Separated  ? ? ?Family History  ?Problem Relation Age of Onset  ? Hypertension Mother   ? Hypertension Paternal Grandmother   ? Cancer Paternal Grandmother   ?     lung  ? Cancer Paternal Grandfather   ?     lung  ? ? ?Medications:       ?Current Outpatient Medications:  ?  desogestrel-ethinyl estradiol (APRI) 0.15-30 MG-MCG tablet, Take 1 tablet by mouth daily., Disp: 1 tablet, Rfl: 11 ?  busPIRone (BUSPAR) 7.5 MG tablet, Take 7.5 mg by mouth 3 (three) times daily., Disp: , Rfl:  ?  cetirizine (ZYRTEC) 10 MG tablet, Take 10 mg by mouth daily., Disp: , Rfl:  ?  clindamycin (CLEOCIN) 300 MG capsule, Take 300 mg by mouth every 6 (six) hours. (Patient not taking: Reported on 11/03/2021), Disp: , Rfl:  ?  doxycycline (VIBRAMYCIN) 100 MG capsule, Take 100 mg by mouth daily. (Patient not taking: Reported on 11/03/2021), Disp: , Rfl:  ?  EPINEPHrine 0.3 mg/0.3 mL IJ SOAJ injection, Inject into the muscle. (Patient not taking: Reported on 11/03/2021), Disp: , Rfl:  ?  ketorolac (TORADOL) 10 MG tablet, Take 10 mg by mouth every 6 (six) hours as needed., Disp: , Rfl:  ?  Ketotifen Fumarate (ALAWAY OP), Apply to eye., Disp: , Rfl:  ?  levonorgestrel (MIRENA) 20 MCG/24HR IUD, 1 each by Intrauterine route once., Disp: , Rfl:  ?  mirabegron ER (MYRBETRIQ) 50 MG TB24 tablet, Take 1 tablet (50  mg total) by mouth daily., Disp: 14 tablet, Rfl: 0 ?  phenazopyridine (PYRIDIUM) 100 MG tablet, Take 1 tablet (100 mg total) by mouth 3 (three) times daily as needed for pain., Disp: 10 tablet, Rfl: 0 ?  Polyethyl Glycol-Propyl Glycol (SYSTANE ULTRA OP), Apply to eye., Disp: , Rfl:  ?  predniSONE (DELTASONE)  20 MG tablet, Take 20 mg by mouth 2 (two) times daily., Disp: , Rfl:  ?  pregabalin (LYRICA) 50 MG capsule, Take 1 capsule (50 mg total) by mouth 3 (three) times daily., Disp: 90 capsule, Rfl: 2 ?  RESTASIS 0.05 % ophthalmic emulsion, Place 1 drop into the right eye 2 (two) times daily., Disp: , Rfl:  ?  spironolactone (ALDACTONE) 100 MG tablet, Take 100 mg by mouth daily., Disp: , Rfl:  ?  Vibegron (GEMTESA) 75 MG TABS, Take 1 capsule by mouth daily., Disp: 30 tablet, Rfl: 0 ?  vitamin B-12 (CYANOCOBALAMIN) 100 MCG tablet, Take 100 mcg by mouth daily., Disp: , Rfl:  ? ?Objective ?Blood pressure 117/82, pulse 89, height '5\' 5"'$  (1.651 m), weight 238 lb (108 kg). ? ?General WDWN female NAD ?Vulva:  normal appearing vulva with no masses, tenderness or lesions ?Vagina:  normal mucosa, no discharge ?Cervix:  Normal no lesions ?Uterus:  normal size, contour, position, consistency, mobility, non-tender ?Adnexa: ovaries:present,  normal adnexa in size, nontender and no masses ? ? ? ?Pertinent ROS ?No burning with urination, frequency or urgency ?No nausea, vomiting or diarrhea ?Nor fever chills or other constitutional symptoms ? ? ?Labs or studies ?Reviewed her CT, MRI and sonogram ? ? ? ?Impression ?Diagnoses this Encounter:: ?  ICD-10-CM   ?1. Dyspareunia in female  N94.10   ?  ?2. Pelvic pain, right sided  R10.2   ?  ? ? ?Established relevant diagnosis(es): ? ? ?Plan/Recommendations: ?Meds ordered this encounter  ?Medications  ? desogestrel-ethinyl estradiol (APRI) 0.15-30 MG-MCG tablet  ?  Sig: Take 1 tablet by mouth daily.  ?  Dispense:  1 tablet  ?  Refill:  11  ? ? ?Labs or Scans Ordered: ?No orders of the defined  types were placed in this encounter. ? ? ?Management:: ?Trial of COC reassess in 3 months ? ?Follow up ?Return in about 3 months (around 02/16/2022) for Follow up, with Dr Elonda Husky. ? ? ? ? ? ? All questions were answere

## 2021-11-17 ENCOUNTER — Other Ambulatory Visit: Payer: Self-pay

## 2021-11-17 ENCOUNTER — Encounter: Payer: Self-pay | Admitting: Orthopedic Surgery

## 2021-11-17 DIAGNOSIS — M545 Low back pain, unspecified: Secondary | ICD-10-CM

## 2021-11-17 DIAGNOSIS — M4306 Spondylolysis, lumbar region: Secondary | ICD-10-CM

## 2021-11-17 DIAGNOSIS — M898X8 Other specified disorders of bone, other site: Secondary | ICD-10-CM

## 2021-11-17 NOTE — Telephone Encounter (Signed)
Will be ready by 11:00 am today, 11/17/21 ?

## 2021-11-20 ENCOUNTER — Other Ambulatory Visit: Payer: Managed Care, Other (non HMO) | Admitting: Urology

## 2021-11-22 ENCOUNTER — Ambulatory Visit: Payer: Managed Care, Other (non HMO) | Admitting: Orthopedic Surgery

## 2021-11-24 ENCOUNTER — Other Ambulatory Visit: Payer: Managed Care, Other (non HMO) | Admitting: Urology

## 2021-11-27 ENCOUNTER — Ambulatory Visit: Payer: Managed Care, Other (non HMO) | Admitting: Obstetrics & Gynecology

## 2021-11-29 ENCOUNTER — Encounter: Payer: Self-pay | Admitting: Urology

## 2021-11-29 ENCOUNTER — Ambulatory Visit (INDEPENDENT_AMBULATORY_CARE_PROVIDER_SITE_OTHER): Payer: Managed Care, Other (non HMO) | Admitting: Urology

## 2021-11-29 VITALS — BP 117/79 | HR 105

## 2021-11-29 DIAGNOSIS — M6289 Other specified disorders of muscle: Secondary | ICD-10-CM

## 2021-11-29 DIAGNOSIS — R350 Frequency of micturition: Secondary | ICD-10-CM | POA: Diagnosis not present

## 2021-11-29 DIAGNOSIS — R300 Dysuria: Secondary | ICD-10-CM | POA: Diagnosis not present

## 2021-11-29 MED ORDER — SOLIFENACIN SUCCINATE 5 MG PO TABS: 5.0000 mg | ORAL_TABLET | Freq: Every day | ORAL | 1 refills | Status: DC

## 2021-11-29 NOTE — Patient Instructions (Signed)
Urinary Frequency, Adult ?Urinary frequency means urinating more often than usual. You may urinate every 1-2 hours even though you drink a normal amount of fluid and do not have a bladder infection or condition. Although you urinate more often than normal, the total amount of urine produced in a day is normal. ?With urinary frequency, you may have an urgent need to urinate often. The stress and anxiety of needing to find a bathroom quickly can make this urge worse. This condition may go away on its own, or you may need treatment at home. Home treatment may include bladder training, exercises, taking medicines, or making changes to your diet. ?Follow these instructions at home: ?Bladder health ?Your health care provider will tell you what to do to improve bladder health. You may be told to: ?Keep a bladder diary. Keep track of: ?What you eat and drink. ?How often you urinate. ?How much you urinate. ?Follow a bladder training program. This may include: ?Learning to delay going to the bathroom. ?Double urinating, also called voiding. This helps if you are not completely emptying your bladder. ?Scheduled voiding. ?Do Kegel exercises. Kegel exercises strengthen the muscles that help control urination, which may help the condition. ? ?Eating and drinking ?Follow instructions from your health care provider about eating or drinking restrictions. You may be told to: ?Avoid caffeine. ?Drink fewer fluids, especially alcohol. ?Avoid drinking in the evening. ?Avoid foods or drinks that may irritate the bladder. These include coffee, tea, soda, artificial sweeteners, citrus, tomato-based foods, and chocolate. ?Eat foods that help prevent or treat constipation. Constipation can make urinary frequency worse. You may need to take these actions to prevent or treat constipation: ?Drink enough fluid to keep your urine pale yellow. ?Take over-the-counter or prescription medicines. ?Eat foods that are high in fiber, such as beans, whole  grains, and fresh fruits and vegetables. ?Limit foods that are high in fat and processed sugars, such as fried or sweet foods. ?General instructions ?Take over-the-counter and prescription medicines only as told by your health care provider. ?Keep all follow-up visits. This is important. ?Contact a health care provider if: ?You start urinating more often. ?You feel pain or irritation when you urinate. ?You notice blood in your urine. ?Your urine looks cloudy. ?You develop a fever. ?You begin vomiting. ?Get help right away if: ?You are unable to urinate. ?Summary ?Urinary frequency means urinating more often than usual. With urinary frequency, you may urinate every 1-2 hours even though you drink a normal amount of fluid and do not have a bladder infection or other bladder condition. ?Your health care provider may recommend that you keep a bladder diary, follow a bladder training program, or make dietary changes. ?If told by your health care provider, do Kegel exercises to strengthen the muscles that help control urination. ?Take over-the-counter and prescription medicines only as told by your health care provider. ?Contact a health care provider if your symptoms do not improve or get worse. ?This information is not intended to replace advice given to you by your health care provider. Make sure you discuss any questions you have with your health care provider. ?Document Revised: 03/25/2020 Document Reviewed: 03/25/2020 ?Elsevier Patient Education ? [place]. ? ?

## 2021-11-29 NOTE — Progress Notes (Signed)
? ?11/29/2021 ?3:24 PM  ? ?Nancy Novak ?Oct 02, 1990 ?[id number] ? ?Referring provider: Glenda Chroman, MD ?[address] ?[place],  [place] [address] ? ?Followup dysuria and urinary frequency ? ? ?HPI: ?Nancy Novak is a 31yo here for followup for urinary frequency and dysuria. Gemtesa failed to improve her urinary urgency and frequency. She has also tried mirabegron which has failed to improve her urinary urgency and frequency. Stress makes her LUTS worse. She has issues with constipation. She has pain with sexual intercourse. She has urinary frequency every 30-90 minutes, she has daily urgency and rare urge incontinence. She continues to have dysuria but has a strong urine stream. No other complaints today ? ? ?PMH: ?Past Medical History:  ?Diagnosis Date  ? Asthma   ? ? ?Surgical History: ?Past Surgical History:  ?Procedure Laterality Date  ? CHOLECYSTECTOMY N/A 04/26/2017  ? Procedure: LAPAROSCOPIC CHOLECYSTECTOMY;  Surgeon: Aviva Signs, MD;  Location: [facility];  Service: General;  Laterality: N/A;  ? CYST EXCISION    ? wisdom teeth    ? ? ?Home Medications:  ?Allergies as of 11/29/2021   ? ?   Reactions  ? Hydroxyzine Hives, Rash  ? Bee Venom Swelling  ? Other Hives  ? Bell peppers  ? Codeine Hives, Nausea Only, Rash  ? ?  ? ?  ?Medication List  ?  ? ?  ? Accurate as of November 29, 2021  3:24 PM. If you have any questions, ask your nurse or doctor.  ?  ?  ? ?  ? ?ALAWAY OP ?Apply to eye. ?  ?busPIRone 7.5 MG tablet ?Commonly known as: BUSPAR ?Take 7.5 mg by mouth 3 (three) times daily. ?  ?cetirizine 10 MG tablet ?Commonly known as: ZYRTEC ?Take 10 mg by mouth daily. ?  ?clindamycin 300 MG capsule ?Commonly known as: CLEOCIN ?Take 300 mg by mouth every 6 (six) hours. ?  ?desogestrel-ethinyl estradiol 0.15-30 MG-MCG tablet ?Commonly known as: APRI ?Take 1 tablet by mouth daily. ?  ?doxycycline 100 MG capsule ?Commonly known as: VIBRAMYCIN ?Take 100 mg by mouth daily. ?  ?EPINEPHrine 0.3 mg/0.3 mL Soaj injection ?Commonly  known as: EPI-PEN ?Inject into the muscle. ?  ?Gemtesa 75 MG Tabs ?Generic drug: Vibegron ?Take 1 capsule by mouth daily. ?  ?ketorolac 10 MG tablet ?Commonly known as: TORADOL ?Take 10 mg by mouth every 6 (six) hours as needed. ?  ?levonorgestrel 20 MCG/24HR IUD ?Commonly known as: MIRENA ?1 each by Intrauterine route once. ?  ?mirabegron ER 50 MG Tb24 tablet ?Commonly known as: MYRBETRIQ ?Take 1 tablet (50 mg total) by mouth daily. ?  ?phenazopyridine 100 MG tablet ?Commonly known as: Pyridium ?Take 1 tablet (100 mg total) by mouth 3 (three) times daily as needed for pain. ?  ?predniSONE 20 MG tablet ?Commonly known as: DELTASONE ?Take 20 mg by mouth 2 (two) times daily. ?  ?pregabalin 50 MG capsule ?Commonly known as: LYRICA ?Take 1 capsule (50 mg total) by mouth 3 (three) times daily. ?  ?Restasis 0.05 % ophthalmic emulsion ?Generic drug: cycloSPORINE ?Place 1 drop into the right eye 2 (two) times daily. ?  ?spironolactone 100 MG tablet ?Commonly known as: ALDACTONE ?Take 100 mg by mouth daily. ?  ?SYSTANE ULTRA OP ?Apply to eye. ?  ?vitamin B-12 100 MCG tablet ?Commonly known as: CYANOCOBALAMIN ?Take 100 mcg by mouth daily. ?  ? ?  ? ? ?Allergies:  ?Allergies  ?Allergen Reactions  ? Hydroxyzine Hives and Rash  ? Bee Venom Swelling  ?  Other Hives  ?  Bell peppers  ? Codeine Hives, Nausea Only and Rash  ? ? ?Family History: ?Family History  ?Problem Relation Age of Onset  ? Hypertension Mother   ? Hypertension Paternal Grandmother   ? Cancer Paternal Grandmother   ?     lung  ? Cancer Paternal Grandfather   ?     lung  ? ? ?Social History:  reports that she has never smoked. She has never used smokeless tobacco. She reports that she does not drink alcohol and does not use drugs. ? ?ROS: ?All other review of systems were reviewed and are negative except what is noted above in HPI ? ?Physical Exam: ?BP 117/79   Pulse (!) 105   ?Constitutional:  Alert and oriented, No acute distress. ?HEENT: [place] AT, moist mucus  membranes.  Trachea midline, no masses. ?Cardiovascular: No clubbing, cyanosis, or edema. ?Respiratory: Normal respiratory effort, no increased work of breathing. ?GI: Abdomen is soft, nontender, nondistended, no abdominal masses ?GU: No CVA tenderness.  ?Lymph: No cervical or inguinal lymphadenopathy. ?Skin: No rashes, bruises or suspicious lesions. ?Neurologic: Grossly intact, no focal deficits, moving all 4 extremities. ?Psychiatric: Normal mood and affect. ? ?Laboratory Data: ?Lab Results  ?Component Value Date  ? WBC 14.1 (H) 11/18/2017  ? HGB 13.9 11/18/2017  ? HCT 43.9 11/18/2017  ? MCV 90.5 11/18/2017  ? PLT 328 11/18/2017  ? ? ?Lab Results  ?Component Value Date  ? CREATININE 0.73 11/18/2017  ? ? ?No results found for: PSA ? ?No results found for: TESTOSTERONE ? ?No results found for: HGBA1C ? ?Urinalysis ?   ?Component Value Date/Time  ? COLORURINE YELLOW 11/18/2017 1314  ? APPEARANCEUR Clear 11/03/2021 1241  ? LABSPEC 1.028 11/18/2017 1314  ? PHURINE 5.0 11/18/2017 1314  ? GLUCOSEU Negative 11/03/2021 1241  ? HGBUR MODERATE (A) 11/18/2017 1314  ? BILIRUBINUR Negative 11/03/2021 1241  ? Laie NEGATIVE 11/18/2017 1314  ? PROTEINUR Negative 11/03/2021 1241  ? PROTEINUR NEGATIVE 11/18/2017 1314  ? UROBILINOGEN 0.2 03/24/2010 2121  ? NITRITE Negative 11/03/2021 1241  ? NITRITE NEGATIVE 11/18/2017 1314  ? LEUKOCYTESUR Negative 11/03/2021 1241  ? ? ?Lab Results  ?Component Value Date  ? LABMICR Comment 11/03/2021  ? WBCUA 0-5 10/17/2021  ? LABEPIT >10 (A) 10/17/2021  ? BACTERIA Moderate (A) 10/17/2021  ? ? ?Pertinent Imaging: ? ?No results found for this or any previous visit. ? ?No results found for this or any previous visit. ? ?No results found for this or any previous visit. ? ?No results found for this or any previous visit. ? ?No results found for this or any previous visit. ? ?No results found for this or any previous visit. ? ?Results for orders placed during the hospital encounter of 10/18/21 ? ?CT  HEMATURIA WORKUP ? ?Narrative ?CLINICAL DATA:  Gross hematuria. 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 Dysuria. ? ?EXAM: ?CT ABDOMEN AND PELVIS WITHOUT AND WITH CONTRAST ? ?TECHNIQUE: ?Multidetector CT imaging of the abdomen and pelvis was performed ?following the standard protocol before and following the bolus ?administration of intravenous contrast. ? ?RADIATION DOSE REDUCTION: This exam was performed according to the ?departmental dose-optimization program which includes automated ?exposure control, adjustment of the mA and/or kV according to ?patient size and/or use of iterative reconstruction technique. ? ?CONTRAST:  100 cc Omnipaque 300 ? ?COMPARISON:  09/25/2021 ? ?FINDINGS: ?Lower chest: Unremarkable ? ?Hepatobiliary: Cholecystectomy.  Otherwise unremarkable. ? ?Pancreas: Unremarkable ? ?Spleen: Unremarkable ? ?Adrenals/Urinary Tract: Both adrenal glands appear normal. Normal ?renal parenchymal  enhancement. No urinary tract calculi are ?identified. Combined phase post-contrast imaging demonstrates no ?appreciable filling defect along the urothelium. Cause for the ?patient's hematuria is not identified. ? ?Stomach/Bowel: Unremarkable.  Normal appendix. ? ?Vascular/Lymphatic: Unremarkable ? ?Reproductive: IUD satisfactorily positioned. Otherwise unremarkable. ? ?Other: No supplemental non-categorized findings. ? ?Musculoskeletal: Bone island in the left sacrum. 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 ? ?IMPRESSION: ?1. No cause for hematuria identified. No current urinary tract ?calculi are observed. ?2. IUD appears satisfactorily position. 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 ? ? ?Electronically Signed ?By: Van Clines M.D. ?On: 10/18/2021 10:27 ? ?No results found for this or any previous visit. ? ? ?Assessment & Plan:   ? ?1. Pelvic floor dysfunction ?-She will be referred for pelvic floor PT ?- Urinalysis, Routine w reflex microscopic ? ?2.  Urinary frequency ?We will trial vesicare '5mg'$  daily ? ? ?No follow-ups on file. ? ?Nicolette Bang, MD ? ?[facility] [place] ?  ?

## 2021-11-30 LAB — URINALYSIS, ROUTINE W REFLEX MICROSCOPIC
Bilirubin, UA: NEGATIVE
Glucose, UA: NEGATIVE
Ketones, UA: NEGATIVE
Leukocytes,UA: NEGATIVE
Nitrite, UA: NEGATIVE
Protein,UA: NEGATIVE
RBC, UA: NEGATIVE
Specific Gravity, UA: 1.025 (ref 1.005–1.030)
Urobilinogen, Ur: 0.2 mg/dL (ref 0.2–1.0)
pH, UA: 7 (ref 5.0–7.5)

## 2021-12-07 ENCOUNTER — Encounter: Payer: Self-pay | Admitting: Physical Therapy

## 2021-12-07 ENCOUNTER — Ambulatory Visit: Payer: Managed Care, Other (non HMO) | Attending: Urology | Admitting: Physical Therapy

## 2021-12-07 DIAGNOSIS — R279 Unspecified lack of coordination: Secondary | ICD-10-CM | POA: Diagnosis not present

## 2021-12-07 DIAGNOSIS — M6289 Other specified disorders of muscle: Secondary | ICD-10-CM | POA: Diagnosis present

## 2021-12-07 DIAGNOSIS — M6281 Muscle weakness (generalized): Secondary | ICD-10-CM | POA: Diagnosis not present

## 2021-12-07 DIAGNOSIS — R293 Abnormal posture: Secondary | ICD-10-CM | POA: Diagnosis not present

## 2021-12-07 DIAGNOSIS — M62838 Other muscle spasm: Secondary | ICD-10-CM | POA: Diagnosis not present

## 2021-12-07 NOTE — Patient Instructions (Addendum)
? ? ? ?Urge Incontinence ? ?Ideal urination frequency is every 2-4 wakeful hours, which equates to 5-8 times within a 24-hour period.   ?Urge incontinence is leakage that occurs when the bladder muscle contracts, creating a sudden need to go before getting to the bathroom.   ?Going too often when your bladder isn't actually full can disrupt the body's automatic signals to store and hold urine longer, which will increase urgency/frequency.  In this case, the bladder ?is running the show? and strategies can be learned to retrain this pattern.   ?One should be able to control the first urge to urinate, at around 163m.  The bladder can hold up to a ?grande latte,? or 406m ?To help you gain control, practice the Urge Drill below when urgency strikes.  This drill will help retrain your bladder signals and allow you to store and hold urine longer.  The overall goal is to stretch out your time between voids to reach a more manageable voiding schedule.    ?Practice your "quick flicks" often throughout the day (each waking hour) even when you don't need feel the urge to go.  This will help strengthen your pelvic floor muscles, making them more effective in controlling leakage. ? ?Urge Drill ? ?When you feel an urge to go, follow these steps to regain control: ?Stop what you are doing and be still ?Take one deep breath, directing your air into your abdomen ?Think an affirming thought, such as ?I've got this.? ?Do 5 quick flicks of your pelvic floor ?Walk with control to the bathroom to void, or delay voiding ? ? ?Moisturizers ?They are used in the vagina to hydrate the mucous membrane that make up the vaginal canal. ?Designed to keep a more normal acid balance (ph) ?Once placed in the vagina, it will last between two to three days.  ?Use 2-3 times per week at bedtime  ?Ingredients to avoid is glycerin and fragrance, can increase chance of infection ?Should not be used just before sex due to causing irritation ?Most are  gels administered either in a tampon-shaped applicator as a vaginal suppository. They are non-hormonal. ? ? ?Types of Moisturizers(internal use) ? ?Vitamin E vaginal suppositories- Whole foods, Amazon ?Moist Again ?Coconut oil- can break down condoms ?Julva- (Do no use if on Tamoxifen) amazon ?Yes moisturizer- amazon ?NeuEve Silk , NeuEve Silver for menopausal or over 65 (if have severe vaginal atrophy or cancer treatments use NeuEve Silk for  1 month than move to NeThe Pepsi AmDover CorporationNeElwoodom ?Olive and Bee intimate cream- www.oliveandbee.com.au ?Mae vaginal moisturizer- Amazon ?Aloe ? ? ? ?Creams to use externally on the Vulva area ?Desert HaSara Leegood for for cancer patients that had radiation to the area)- am[place] wwDanaher CorporationeFlyingBasics.com.brV-magic cream - amazon ?Julva-amazon ?Vital "V [place] salve ( help moisturize and help with thinning vulvar area, does have Beeswax ?Mo[place]ert HaConsecoCleo by Damiva labial moisturizer ([place] ?Coconut or olive oil ?aloe ? ? ?Things to avoid in the vaginal area ?Do not use things to irritate the vulvar area ?No lotions just specialized creams for the vulva area- Neogyn, V-magic, No soaps; can use Aveeno or Calendula cleanser if needed. Must be gentle ?No deodorants ?No douches ?Good to sleep without underwear to let the vaginal area to air out ?No scrubbing: spread the lips to let warm water rinse over labias and pat dry ? ? ?Lubrication ?Used for intercourse to reduce friction ?Avoid ones that have  glycerin, nonoxynol-9, petroleum, propylene glycol, chlorhexidine gluconate, warming gels, tingling gels, icing or cooling gel, scented ?Avoid parabens due to a preservative similar to female sex hormone ?May need to be reapplied once or several times during sexual activity ?Can be applied to both partners genitals prior to vaginal penetration to minimize friction or irritation ?Prevent irritation and mucosal  tears that cause post coital pain and increased the risk of vaginal and urinary tract infections ?Oil-based lubricants cannot be used with condoms due to breaking them down.  Least likely to irritate vaginal tissue.  ?Plant based-lubes are safe ?Silicone-based lubrication are thicker and last long and used for post-menopausal women ? ?Vaginal Lubricators ?Here is a list of some suggested lubricators you can use for intercourse. Use the most hypoallergenic product.  You can place on you or your partner.  ?Slippery Stuff ( water based) ?Sylk or Sliquid Natural H2O ( good  if frequent UTI?s)- walmart, amazon ?Sliquid organics silk-(aloe and silicone based ) ?Blossom Organics (www.blossom-organics.com)- (aloe based ) ?Coconut oil, olive oil -not good with condoms  ?PJur Woman Nude- (water based) amazon ?Uberlube- ( silicon) Amazon ?Aloe Vera- Sprouts has an organic one ?Yes lubricant- (water based and has plant oil based similar to silicone) Amazon ?Wet Platinum-Silicone, Target, Walgreens ?Olive and Bee intimate cream-  www.oliveandbee.com.au ?[place] ?Wet stuff ?Erosense Sync- walmart, amazon ?Coconu- FailLinks.co.uk ? ?Things to avoid in lubricants are glycerin, warming gels, tingling gels, icing or cooling ? gels, and scented gels.  Also avoid Vaseline. ?KY jelly, Replens, and Astroglide contain chlorhexidine which kills good bacteria(lactobacilli) ? ?Things to avoid in the vaginal area ?Do not use things to irritate the vulvar area ?No lotions- see below ?Soaps you  can use :Aveeno, Calendula, Good Clean Love cleanser if needed. Must be gentle ?No deodorants ?No douches ?Good to sleep without underwear to let the vaginal area to air out ?No scrubbing: spread the lips to let warm water rinse over labias and pat dry ? ?Creams that can be used on the Vulva Area ?V magic-amazon, walmart ?[place] ?Julva- Amazon ?MoonMaid Botanical Pro-[place]eno Wild [place] Cream ?Coconut oil, olive oil ?Cleo by Damiva labial  moisturizer -[place],  ?Desert Havest Releveum ( lidocaine) or Desert Conseco ?Yes Moisturizer ? ?   ? ?
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
## 2021-12-15 ENCOUNTER — Telehealth: Payer: Self-pay

## 2021-12-15 NOTE — Telephone Encounter (Signed)
Got a message off voicemail from [facility] checking on request for records on this patient. ?Request was faxed 12/13/21. The contact # is [phone] ext# [phone]. ?Fax # [phone] ?

## 2021-12-18 ENCOUNTER — Encounter: Payer: Managed Care, Other (non HMO) | Admitting: Physical Therapy

## 2021-12-21 ENCOUNTER — Other Ambulatory Visit: Payer: Self-pay | Admitting: Urology

## 2021-12-28 ENCOUNTER — Ambulatory Visit: Payer: Managed Care, Other (non HMO) | Admitting: Physical Therapy

## 2021-12-28 DIAGNOSIS — R293 Abnormal posture: Secondary | ICD-10-CM

## 2021-12-28 DIAGNOSIS — R279 Unspecified lack of coordination: Secondary | ICD-10-CM

## 2021-12-28 DIAGNOSIS — M62838 Other muscle spasm: Secondary | ICD-10-CM

## 2021-12-28 DIAGNOSIS — M6281 Muscle weakness (generalized): Secondary | ICD-10-CM

## 2021-12-28 DIAGNOSIS — M6289 Other specified disorders of muscle: Secondary | ICD-10-CM | POA: Diagnosis not present
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
## 2021-12-28 NOTE — Patient Instructions (Signed)

## 2022-01-01 ENCOUNTER — Encounter: Payer: Self-pay | Admitting: Orthopedic Surgery

## 2022-01-08 ENCOUNTER — Other Ambulatory Visit: Payer: Self-pay | Admitting: Urology

## 2022-01-11 ENCOUNTER — Ambulatory Visit: Payer: Managed Care, Other (non HMO) | Admitting: Physical Therapy

## 2022-02-19 ENCOUNTER — Ambulatory Visit: Payer: PRIVATE HEALTH INSURANCE | Admitting: Obstetrics and Gynecology

## 2022-03-02 ENCOUNTER — Ambulatory Visit: Payer: Managed Care, Other (non HMO) | Admitting: Urology

## 2022-11-01 ENCOUNTER — Encounter: Payer: Self-pay | Admitting: Radiology

## 2023-01-07 ENCOUNTER — Telehealth: Payer: Self-pay

## 2023-01-07 NOTE — Telephone Encounter (Signed)
Requested documents faxed to [facility]

## 2023-01-14 ENCOUNTER — Encounter (INDEPENDENT_AMBULATORY_CARE_PROVIDER_SITE_OTHER): Payer: Self-pay | Admitting: *Deleted

## 2023-03-12 ENCOUNTER — Ambulatory Visit (INDEPENDENT_AMBULATORY_CARE_PROVIDER_SITE_OTHER): Payer: Commercial Managed Care - HMO | Admitting: Gastroenterology

## 2023-03-26 ENCOUNTER — Ambulatory Visit (INDEPENDENT_AMBULATORY_CARE_PROVIDER_SITE_OTHER): Payer: Commercial Managed Care - HMO | Admitting: Gastroenterology

## 2023-04-04 ENCOUNTER — Ambulatory Visit (INDEPENDENT_AMBULATORY_CARE_PROVIDER_SITE_OTHER): Payer: Commercial Managed Care - HMO | Admitting: Gastroenterology

## 2023-04-04 ENCOUNTER — Encounter (INDEPENDENT_AMBULATORY_CARE_PROVIDER_SITE_OTHER): Payer: Self-pay | Admitting: Gastroenterology

## 2023-04-04 VITALS — BP 138/84 | Temp 98.8°F | Ht 65.0 in | Wt 255.3 lb

## 2023-04-04 DIAGNOSIS — K529 Noninfective gastroenteritis and colitis, unspecified: Secondary | ICD-10-CM

## 2023-04-04 DIAGNOSIS — J45909 Unspecified asthma, uncomplicated: Secondary | ICD-10-CM

## 2023-04-04 DIAGNOSIS — R140 Abdominal distension (gaseous): Secondary | ICD-10-CM | POA: Insufficient documentation

## 2023-04-04 DIAGNOSIS — Z7722 Contact with and (suspected) exposure to environmental tobacco smoke (acute) (chronic): Secondary | ICD-10-CM

## 2023-04-04 DIAGNOSIS — K9089 Other intestinal malabsorption: Secondary | ICD-10-CM

## 2023-04-04 DIAGNOSIS — K580 Irritable bowel syndrome with diarrhea: Secondary | ICD-10-CM

## 2023-04-04 DIAGNOSIS — K589 Irritable bowel syndrome without diarrhea: Secondary | ICD-10-CM | POA: Insufficient documentation

## 2023-04-04 MED ORDER — COLESTIPOL HCL 1 G PO TABS
2.0000 g | ORAL_TABLET | Freq: Every day | ORAL | 0 refills | Status: DC
Start: 2023-04-04 — End: 2023-07-15

## 2023-04-04 NOTE — Patient Instructions (Addendum)
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 Patient was counseled about the benefit of implementing a low FODMAP to improve symptoms and recurrent episodes. A dietary list was provided to the patient. Also, the patient was counseled about the benefit of avoiding stressing situations and potential environmental triggers leading to symptomatology.

## 2023-04-04 NOTE — Progress Notes (Signed)
Nancy Novak, M.D. Gastroenterology & Hepatology [facility] [address] [place], [place] [address] Primary Care Physician: Ignatius Specking, MD [address] [place] [address]  Referring MD: PCP  Chief Complaint: Diarrhea  History of Present Illness: Nancy Novak is a 32 y.o. female with asthma, who presents for evaluation of diarrhea.  Patient reports that after she had her gallbladder removed (due to "decreased ejection" and nausea/vomiting) in 2018, she noticed having frequent bowel movements. She reports 5-10 bowel movements per day - stool was yellow and bright, with watery consistency. Patient underwent laparoscopic appendectomy and primary repair of umbilical hernia by Dr. Aurea Graff on 12/27/2022. Since then, the diarrhea worsened and she was having watery diarrhea 14-20 bowel movements per day. She reported having severe dehydration and was prescribed some OTC antidiarrheal - Imodium possibly. States diarrhea did not improve a lot with Imodium, she was taking it as needed. She has had some improvement in number of bowel movements, now having 5 Bms per day. Has frequent urgency.  She reports having chronic abdominal pain complaints, which worsened after her cholecystectomy. States having diffuse abdominal pain daily, her abdomen is very sensitive to touch. She feels constantly bloated.  The patient denies having any nausea, vomiting, fever, chills, hematochezia, melena, hematemesis,jaundice, pruritus or weight loss.  Last EXB:MWUXL Last Colonoscopy:never  FHx: neg for any gastrointestinal/liver disease, aunt colon cancer. Lung cancer grandmother Social: neg smoking, alcohol or illicit drug use Surgical: cholecystectomy, appendectomy  Past Medical History: Past Medical History:  Diagnosis Date   Asthma    Hypoglycemia     Past Surgical History: Past Surgical History:  Procedure Laterality Date   APPENDECTOMY      CHOLECYSTECTOMY N/A 04/26/2017   Procedure: LAPAROSCOPIC CHOLECYSTECTOMY;  Surgeon: Franky Macho, MD;  Location: [facility];  Service: General;  Laterality: N/A;   CYST EXCISION     wisdom teeth      Family History: Family History  Problem Relation Age of Onset   Hypertension Mother    Hypertension Paternal Grandmother    Cancer Paternal Grandmother        lung   Cancer Paternal Grandfather        lung    Social History: Social History   Tobacco Use  Smoking Status Never   Passive exposure: Current  Smokeless Tobacco Never   Social History   Substance and Sexual Activity  Alcohol Use No   Social History   Substance and Sexual Activity  Drug Use No    Allergies: Allergies  Allergen Reactions   Hydroxyzine Hives and Rash   Bee Venom Swelling   Other Hives    Bell peppers   Codeine Hives, Nausea Only and Rash    Medications: Current Outpatient Medications  Medication Sig Dispense Refill   cetirizine (ZYRTEC) 10 MG tablet Take 10 mg by mouth daily.     EPINEPHrine 0.3 mg/0.3 mL IJ SOAJ injection Inject into the muscle.     ketorolac (TORADOL) 10 MG tablet Take 10 mg by mouth every 6 (six) hours as needed.     levonorgestrel (MIRENA) 20 MCG/24HR IUD 1 each by Intrauterine route once.     phenazopyridine (PYRIDIUM) 100 MG tablet Take 1 tablet (100 mg total) by mouth 3 (three) times daily as needed for pain. 10 tablet 0   Polyethyl Glycol-Propyl Glycol (SYSTANE ULTRA OP) Apply to eye.     No current facility-administered medications for this visit.    Review of Systems: GENERAL:  negative for malaise, night sweats HEENT: No changes in hearing or vision, no nose bleeds or other nasal problems. NECK: Negative for lumps, goiter, pain and significant neck swelling RESPIRATORY: Negative for cough, wheezing CARDIOVASCULAR: Negative for chest pain, leg swelling, palpitations, orthopnea GI: SEE HPI MUSCULOSKELETAL: Negative for joint pain or swelling, back pain, and  muscle pain. SKIN: Negative for lesions, rash PSYCH: Negative for sleep disturbance, mood disorder and recent psychosocial stressors. HEMATOLOGY Negative for prolonged bleeding, bruising easily, and swollen nodes. ENDOCRINE: Negative for cold or heat intolerance, polyuria, polydipsia and goiter. NEURO: negative for tremor, gait imbalance, syncope and seizures. The remainder of the review of systems is noncontributory.   Physical Exam: BP 138/84 (BP Location: Left Arm, Patient Position: Sitting, Cuff Size: Large)   Temp 98.8 F (37.1 C) (Oral)   Ht 5\' 5"  (1.651 m)   Wt 255 lb 4.8 oz (115.8 kg)   BMI 42.48 kg/m  GENERAL: The patient is AO x3, in no acute distress.  Obese. HEENT: Head is normocephalic and atraumatic. EOMI are intact. Mouth is well hydrated and without lesions. NECK: Supple. No masses LUNGS: Clear to auscultation. No presence of rhonchi/wheezing/rales. Adequate chest expansion HEART: RRR, normal s1 and s2. ABDOMEN: epigastric tenderness, no guarding, no peritoneal signs, and nondistended. BS +. No masses. EXTREMITIES: Without any cyanosis, clubbing, rash, lesions or edema. NEUROLOGIC: AOx3, no focal motor deficit. SKIN: no jaundice, no rashes   Imaging/Labs: as above  I personally reviewed and interpreted the available labs, imaging and endoscopic files.  Impression and Plan: Nancy Novak is a 32 y.o. female with asthma, who presents for evaluation of diarrhea.  The patient has presented chronic diarrhea, which started after she underwent cholecystectomy and exacerbated after her recent appendectomy.  Has not had significant improvement with the use of Imodium as needed.  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  We will evaluate other etiologies such as celiac disease, hyperthyroidism, electrolyte abnormalities with blood workup today.  It is  possible she may have overlapping symptoms of IBS D, for which she will benefit from implementing a low FODMAP diet and avoiding possible triggers.  - Start colestipol 2 g qday - take this medication 4 hours apart from other medicines -Check CBC, CMP, TSH and celiac disease panel - Explained presumed etiology of IBS symptoms. Patient was counseled about the benefit of implementing a low FODMAP to improve symptoms and recurrent episodes. A dietary list was provided to the patient. Also, the patient was counseled about the benefit of avoiding stressing situations and potential environmental triggers leading to symptomatology.  All questions were answered.      Nancy Blazing, MD Gastroenterology and Hepatology [facility]

## 2023-04-08 ENCOUNTER — Encounter (INDEPENDENT_AMBULATORY_CARE_PROVIDER_SITE_OTHER): Payer: Self-pay | Admitting: Gastroenterology

## 2023-04-08 ENCOUNTER — Other Ambulatory Visit (INDEPENDENT_AMBULATORY_CARE_PROVIDER_SITE_OTHER): Payer: Self-pay | Admitting: Gastroenterology

## 2023-04-08 DIAGNOSIS — K760 Fatty (change of) liver, not elsewhere classified: Secondary | ICD-10-CM

## 2023-04-16 ENCOUNTER — Ambulatory Visit (INDEPENDENT_AMBULATORY_CARE_PROVIDER_SITE_OTHER): Payer: Commercial Managed Care - HMO | Admitting: Obstetrics & Gynecology

## 2023-04-16 ENCOUNTER — Encounter: Payer: Self-pay | Admitting: Obstetrics & Gynecology

## 2023-04-16 ENCOUNTER — Other Ambulatory Visit (HOSPITAL_COMMUNITY)
Admission: RE | Admit: 2023-04-16 | Discharge: 2023-04-16 | Disposition: A | Discharge status: Home / Self Care | Payer: Commercial Managed Care - HMO | Source: Ambulatory Visit | Attending: Obstetrics & Gynecology | Admitting: Obstetrics & Gynecology

## 2023-04-16 VITALS — BP 145/92 | HR 84 | Ht 65.0 in | Wt 254.2 lb

## 2023-04-16 DIAGNOSIS — L723 Sebaceous cyst: Secondary | ICD-10-CM | POA: Diagnosis not present

## 2023-04-16 DIAGNOSIS — Z124 Encounter for screening for malignant neoplasm of cervix: Secondary | ICD-10-CM

## 2023-04-16 DIAGNOSIS — R300 Dysuria: Secondary | ICD-10-CM | POA: Diagnosis not present

## 2023-04-16 DIAGNOSIS — Z975 Presence of (intrauterine) contraceptive device: Secondary | ICD-10-CM

## 2023-04-16 DIAGNOSIS — R350 Frequency of micturition: Secondary | ICD-10-CM | POA: Diagnosis not present

## 2023-04-16 NOTE — Progress Notes (Signed)
   GYN VISIT Patient name: Nancy Novak MRN [id number]  Date of birth: [birth year] Chief Complaint:   Vaginal Pain  History of Present Illness:   Nancy Novak is a 32 y.o. [phone] female being seen today for the following concerns:     Vaginal bump: It's been there for about a year, but it seems to be getting bigger.  Notes some mild pain.  Denies drainage Also notes itching on the same side.  Same partner.  Urinary concerns: Notes occasional burning when she voids and h/o UTIs.   No LMP recorded. (Menstrual status: IUD).    On occasion may have some spotting.   Review of Systems:   Pertinent items are noted in HPI Denies fever/chills, dizziness, headaches, visual disturbances, fatigue, shortness of breath, chest pain, abdominal pain, vomiting, no problems with periods, bowel movements, urination, or intercourse unless otherwise stated above.  Pertinent History Reviewed:   Past Surgical History:  Procedure Laterality Date   APPENDECTOMY     CHOLECYSTECTOMY N/A 04/26/2017   Procedure: LAPAROSCOPIC CHOLECYSTECTOMY;  Surgeon: Franky Macho, MD;  Location: [facility];  Service: General;  Laterality: N/A;   CYST EXCISION     wisdom teeth      Past Medical History:  Diagnosis Date   Asthma    Hypoglycemia    Reviewed problem list, medications and allergies. Physical Assessment:   Vitals:   04/16/23 1156 04/16/23 1222  BP: (!) 141/93 (!) 145/92  Pulse: (!) 103 84  Weight: 254 lb 3.2 oz (115.3 kg)   Height: 5\' 5"  (1.651 m)   Body mass index is 42.3 kg/m.       Physical Examination:   General appearance: alert, well appearing, and in no distress  Psych: mood appropriate, normal affect  Skin: warm & dry   Cardiovascular: normal heart rate noted  Respiratory: normal respiratory effort, no distress  Abdomen: soft, non-tender   Pelvic: right labia majora with patch of multiple sebaceous cyst- each ~38mm in size.  Additional sebaceous (pea-sized raised white firm) cyst  noted on inner portion of right labia.  No other abnormalities noted. Normal vaginal mucosa.  Cervix visualized- strings seen- no lesions or masses noted.  No abnormalities noted on bimanual exam- normal mobile uterus.  No adnexal masses or tenderness appreciated  Extremities: no edema   Chaperone:  Dr. Wyn Forster     Assessment & Plan:  1) Sebaceous cyst -Reassured patient of benign findings -Discussed conservative management versus surgical intervention.  Reviewed risks and benefits including but not limited to risk of bleeding, infection and scar and potential recurrence of sebaceous cyst -Patient desires to just watch for now  2) Preventive screening -pap collected, reviewed ASCCP guidelines  3) Suspected UTI -Urine culture pending  4) IUD in place   Orders Placed This Encounter  Procedures   Urine Culture    Return in about 1 year (around 04/15/2024), or if symptoms worsen or fail to improve, for Annual.   Myna Hidalgo, DO Attending Obstetrician & Gynecologist, [facility], [facility]

## 2023-04-18 ENCOUNTER — Ambulatory Visit (HOSPITAL_COMMUNITY): Payer: Commercial Managed Care - HMO

## 2023-04-19 LAB — CYTOLOGY - PAP: High risk HPV: NEGATIVE

## 2023-07-09 ENCOUNTER — Ambulatory Visit (INDEPENDENT_AMBULATORY_CARE_PROVIDER_SITE_OTHER): Payer: Commercial Managed Care - HMO | Admitting: Gastroenterology

## 2023-07-10 ENCOUNTER — Other Ambulatory Visit (HOSPITAL_COMMUNITY): Payer: Self-pay | Admitting: Otolaryngology

## 2023-07-10 DIAGNOSIS — M25562 Pain in left knee: Secondary | ICD-10-CM

## 2023-07-12 ENCOUNTER — Ambulatory Visit (HOSPITAL_COMMUNITY)
Admission: RE | Admit: 2023-07-12 | Discharge: 2023-07-12 | Disposition: A | Discharge status: Home / Self Care | Payer: Medicaid Other | Source: Ambulatory Visit | Attending: Otolaryngology | Admitting: Otolaryngology

## 2023-07-12 DIAGNOSIS — M25562 Pain in left knee: Secondary | ICD-10-CM | POA: Diagnosis present

## 2023-07-13 ENCOUNTER — Other Ambulatory Visit (INDEPENDENT_AMBULATORY_CARE_PROVIDER_SITE_OTHER): Payer: Self-pay | Admitting: Gastroenterology

## 2023-07-13 DIAGNOSIS — K9089 Other intestinal malabsorption: Secondary | ICD-10-CM

## 2023-09-16 ENCOUNTER — Encounter: Payer: Self-pay | Admitting: Obstetrics & Gynecology

## 2024-06-17 ENCOUNTER — Encounter (INDEPENDENT_AMBULATORY_CARE_PROVIDER_SITE_OTHER): Payer: Self-pay | Admitting: Gastroenterology
